# Patient Record
Sex: Female | Born: 1960 | Race: Black or African American | Hispanic: No | Marital: Single | Smoking: Never smoker
Health system: Southern US, Community
[De-identification: ages and names within clinical notes are randomized; demographics above are authoritative.]

## PROBLEM LIST (undated history)

## (undated) DIAGNOSIS — J45909 Unspecified asthma, uncomplicated: Secondary | ICD-10-CM

## (undated) DIAGNOSIS — I10 Essential (primary) hypertension: Secondary | ICD-10-CM

## (undated) HISTORY — DX: Unspecified asthma, uncomplicated: J45.909

## (undated) HISTORY — PX: TUBAL LIGATION: SHX77

---

## 2005-06-23 ENCOUNTER — Ambulatory Visit: Payer: Self-pay | Admitting: Family Medicine

## 2007-02-26 ENCOUNTER — Emergency Department: Payer: Self-pay | Admitting: Emergency Medicine

## 2007-03-10 ENCOUNTER — Ambulatory Visit: Payer: Self-pay | Admitting: Family Medicine

## 2007-06-19 ENCOUNTER — Emergency Department: Payer: Self-pay | Admitting: Emergency Medicine

## 2007-07-01 ENCOUNTER — Emergency Department: Payer: Self-pay | Admitting: Emergency Medicine

## 2007-07-11 ENCOUNTER — Emergency Department: Payer: Self-pay | Admitting: Emergency Medicine

## 2008-03-23 ENCOUNTER — Emergency Department: Payer: Self-pay | Admitting: Emergency Medicine

## 2010-08-05 IMAGING — CR DG CHEST 1V PORT
1 series · 1 of 1 positions shown · non-contrast
Comparison: none

REASON FOR EXAM: palpitations
COMMENTS:

[view not recorded]
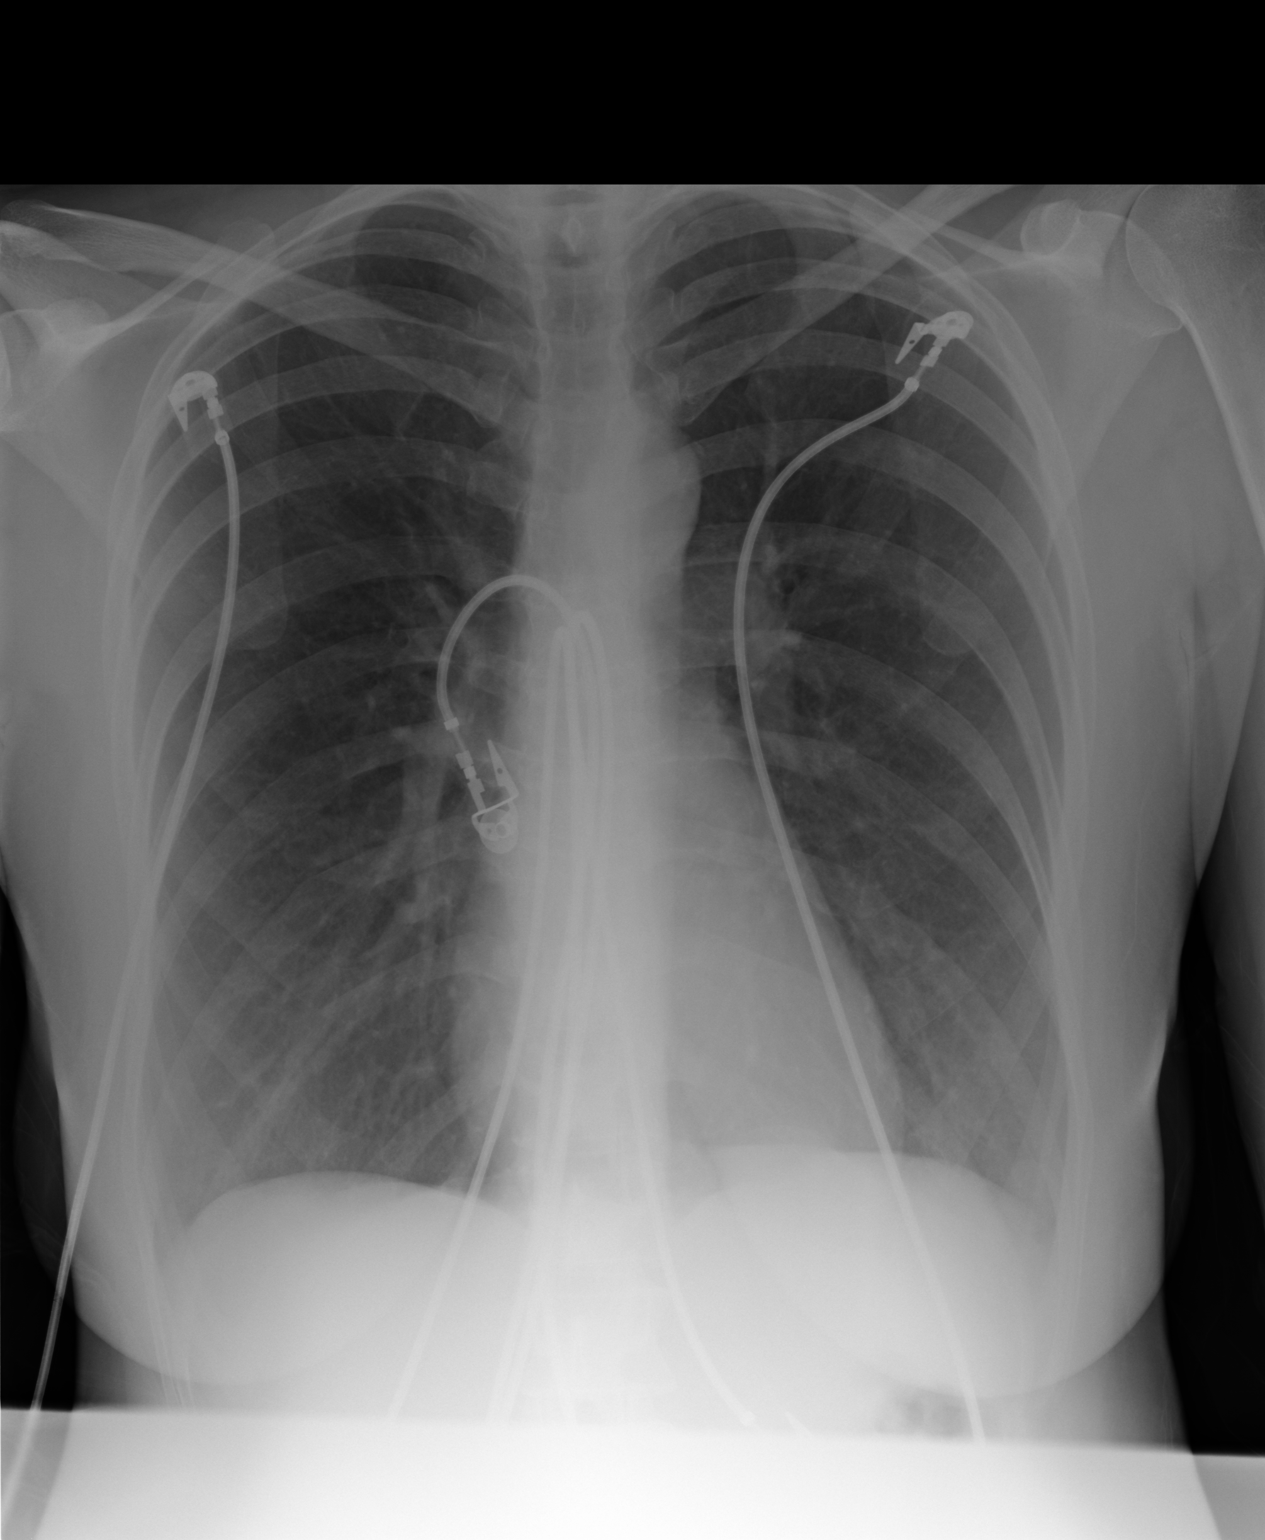

[1 of 1 positions shown; findings below may reference images not displayed]

PROCEDURE:     DXR - DXR PORTABLE CHEST SINGLE VIEW  - March 23, 2008  [DATE]

RESULT:     Comparison is made to a study 08 July, 2007.

The lungs are hyperinflated with hemidiaphragm flattening. The heart is
normal in size. The pulmonary vascularity is not engorged. There is no
evidence of pneumonia or pleural effusion. The visualized portions of the
bony thorax appear normal.
IMPRESSION: There is hyperinflation consistent with reactive airway
disease or COPD. There is no evidence of CHF or pneumonia.

## 2010-08-05 IMAGING — CT CT HEAD WITHOUT CONTRAST
2 series · 16 of 30 positions shown, 20 images · non-contrast
Comparison: none

REASON FOR EXAM: severe headaches x 3 days
COMMENTS:

PROCEDURE:     CT  - CT HEAD WITHOUT CONTRAST  - March 23, 2008  [DATE]
RESULT:     Comparison: 03/10/2007
TECHNIQUE: Multiple axial images from the foramen magnum to the vertex were
obtained without IV contrast.

[Series 2: without · axial · non-contrast · 0.40mm/px · z∈[-148,-28]mm · 13 of 37 slices shown, 17 images]
[im 3/37  brain]
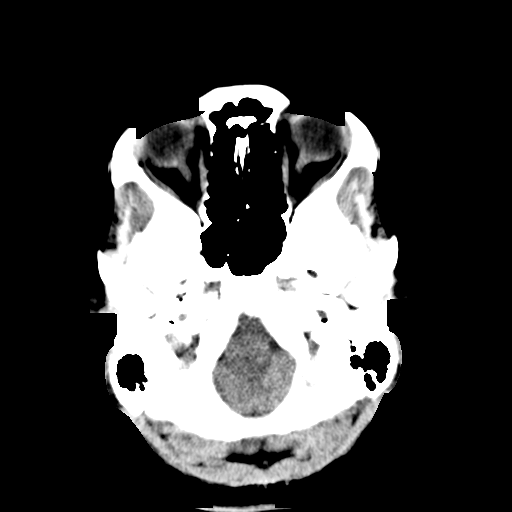
[im 3/37  bone]
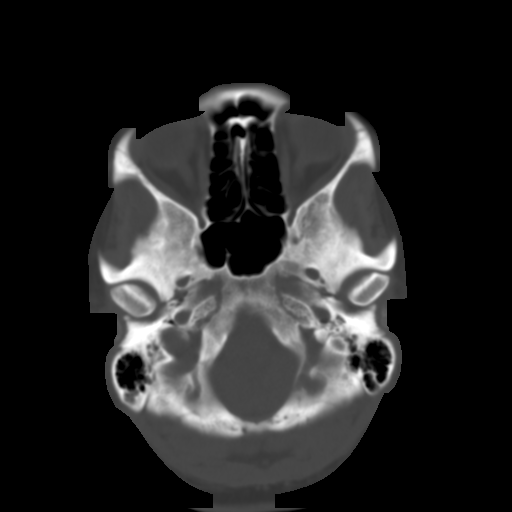
[im 6/37  brain]
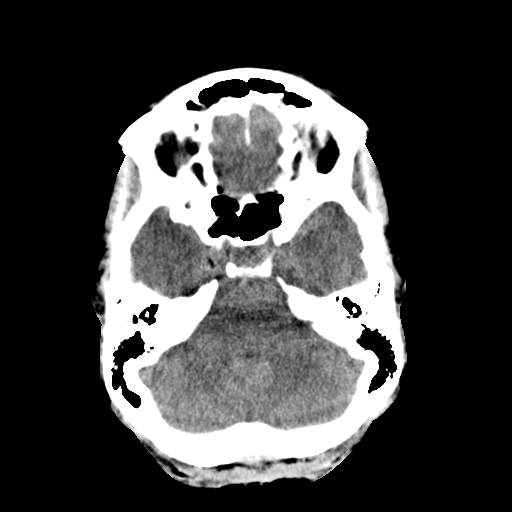
[im 8/37  brain]
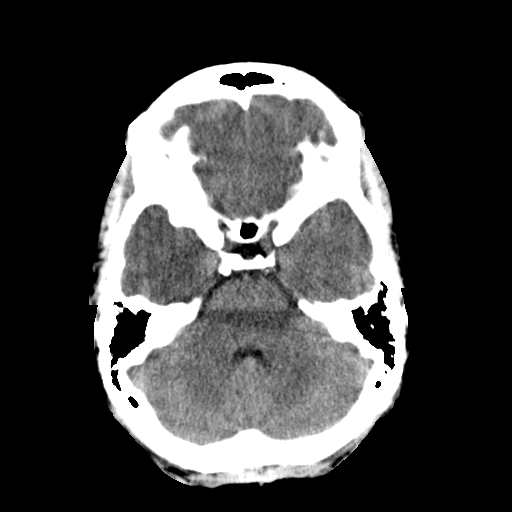
[im 11/37  brain]
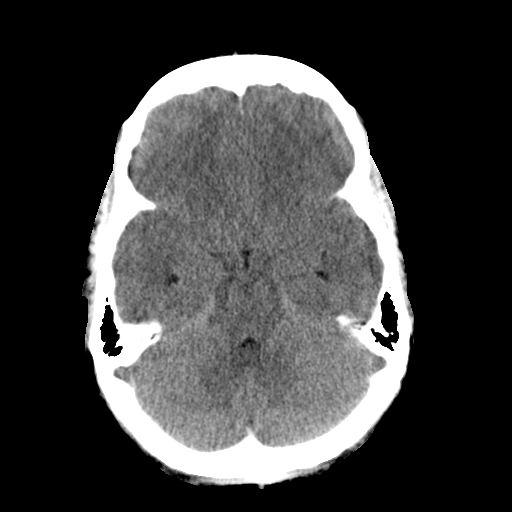
[im 13/37  brain]
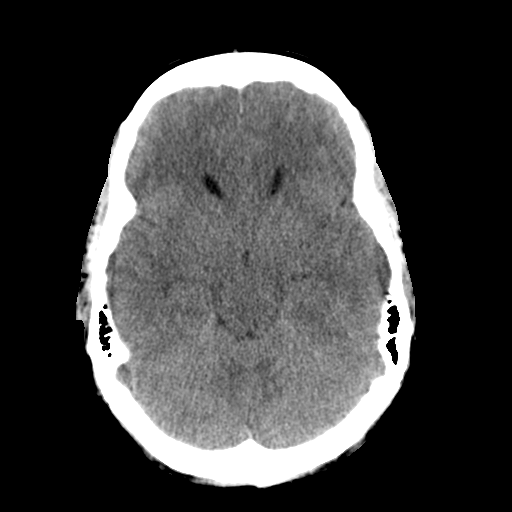
[im 13/37  bone]
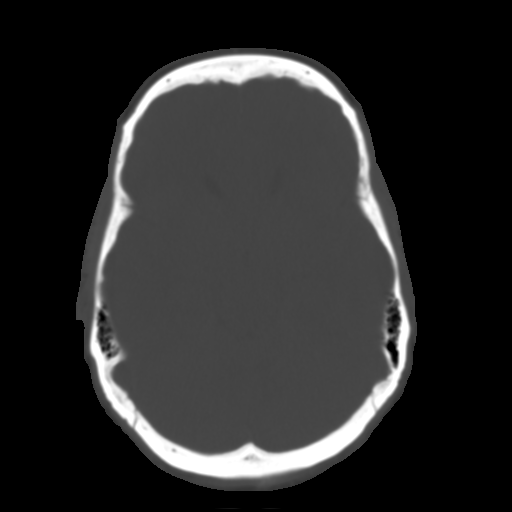
[im 16/37  brain]
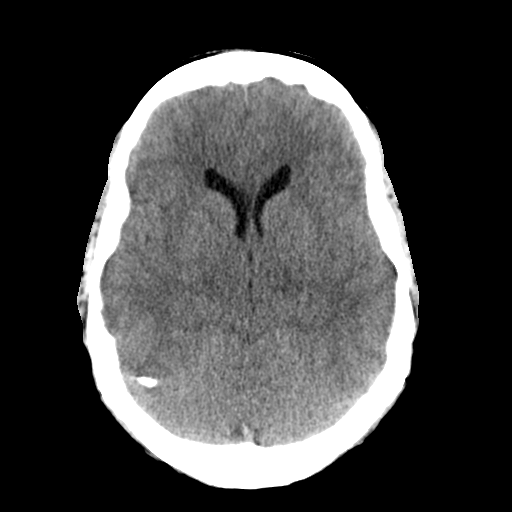
[im 19/37  brain]
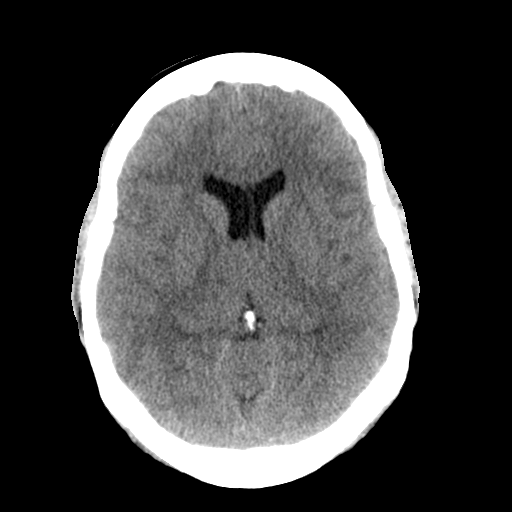
[im 21/37  brain]
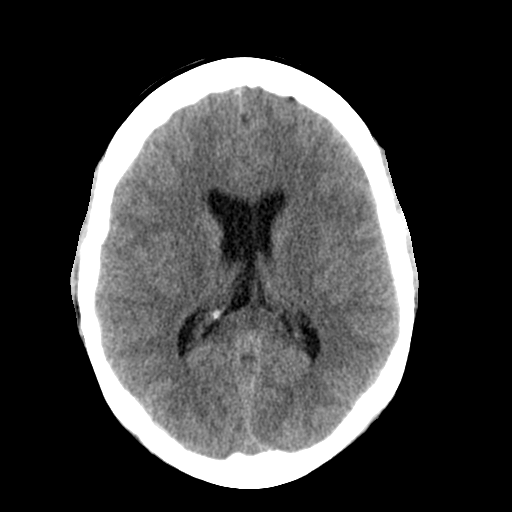
[im 24/37  brain]
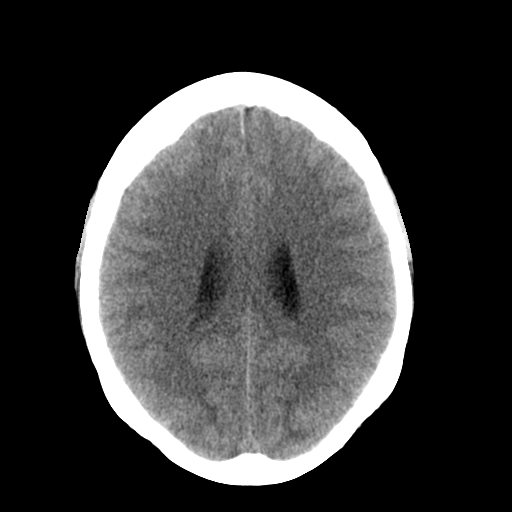
[im 24/37  bone]
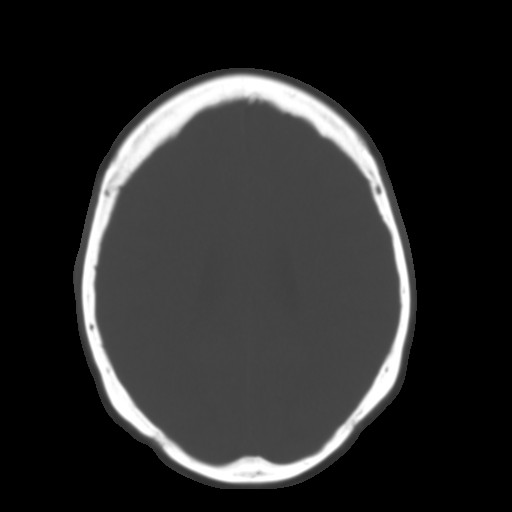
[im 26/37  brain]
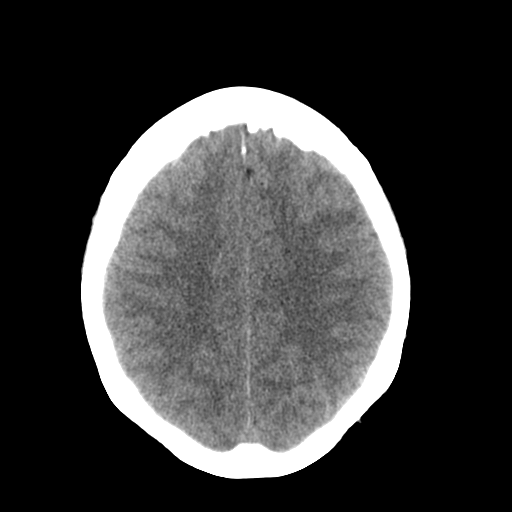
[im 29/37  brain]
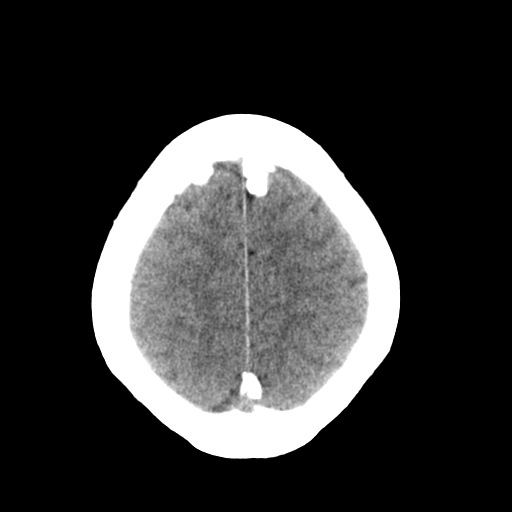
[im 31/37  brain]
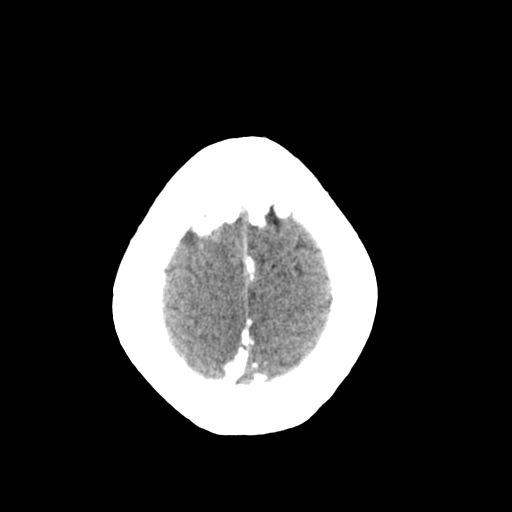
[im 34/37  brain]
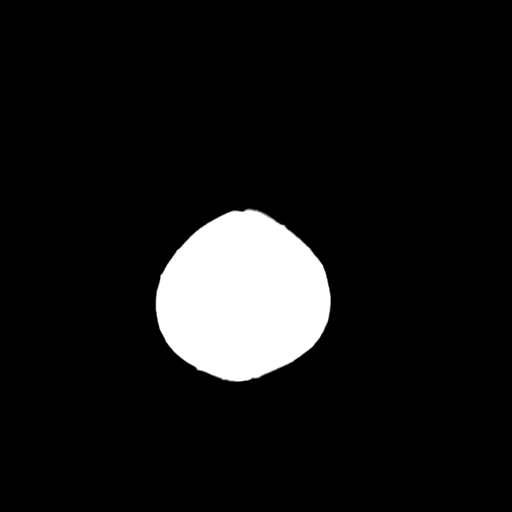
[im 34/37  bone]
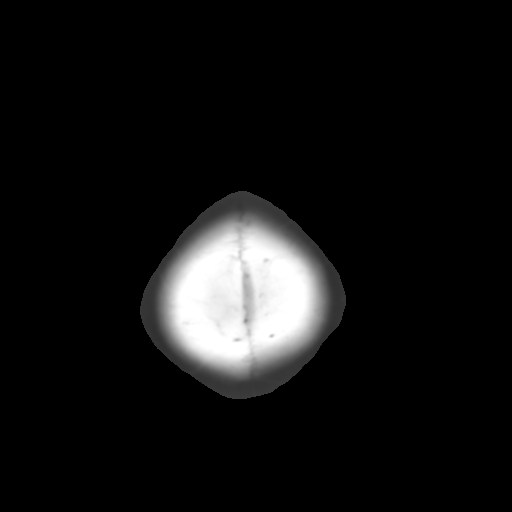

[Series 3: bone · axial · 0.40mm/px · z∈[-148,-118]mm · 3 of 37 slices shown]
[im 3/37  bone]
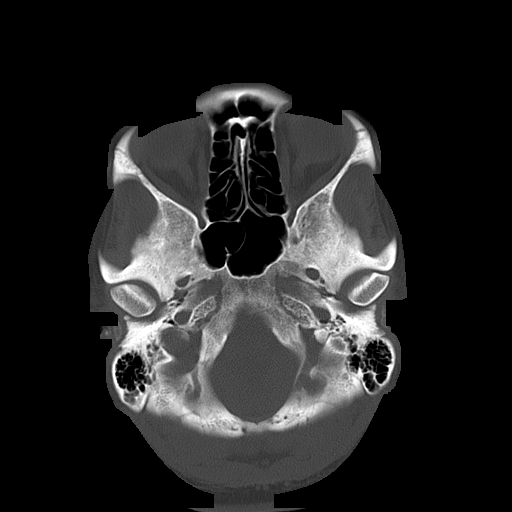
[im 8/37  bone]
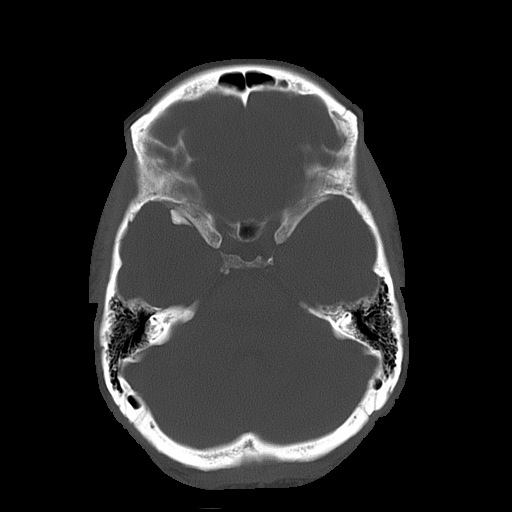
[im 13/37  bone]
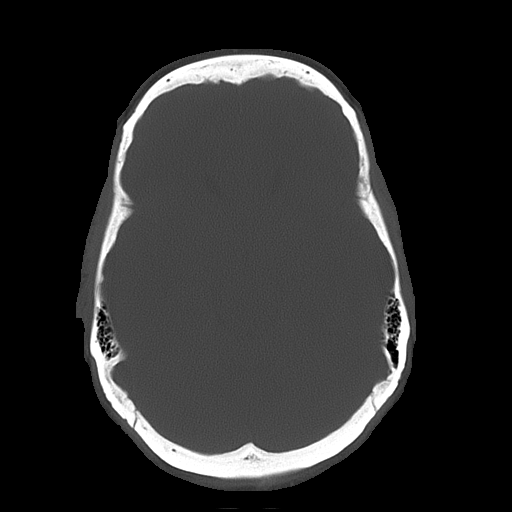

[16 of 30 positions shown; findings below may reference images not displayed]

FINDINGS: There is no evidence for mass effect, midline shift, or extra-axial fluid
collections.  There is no evidence for space-occupying lesion or
intracranial hemorrhage. There is no evidence for a cortical-based area of
acute infarction.

Ventricles and sulci are appropriate for the patient's age. The basal
cisterns are patent.

Visualized portions of the orbits are unremarkable. The paranasal sinuses
and mastoid air cells are unremarkable.

The osseous structures are unremarkable.
IMPRESSION: No acute intracranial process.

## 2014-11-30 ENCOUNTER — Ambulatory Visit: Payer: Self-pay | Admitting: Physician Assistant

## 2022-04-12 ENCOUNTER — Other Ambulatory Visit: Payer: Self-pay

## 2022-04-12 ENCOUNTER — Emergency Department
Admission: EM | Admit: 2022-04-12 | Discharge: 2022-04-13 | Disposition: A | Discharge status: Home / Self Care | Payer: Medicaid Other | Attending: Emergency Medicine | Admitting: Emergency Medicine

## 2022-04-12 ENCOUNTER — Emergency Department: Payer: Medicaid Other

## 2022-04-12 DIAGNOSIS — F419 Anxiety disorder, unspecified: Secondary | ICD-10-CM | POA: Insufficient documentation

## 2022-04-12 DIAGNOSIS — I10 Essential (primary) hypertension: Secondary | ICD-10-CM | POA: Insufficient documentation

## 2022-04-12 DIAGNOSIS — Z20822 Contact with and (suspected) exposure to covid-19: Secondary | ICD-10-CM | POA: Insufficient documentation

## 2022-04-12 DIAGNOSIS — R0789 Other chest pain: Secondary | ICD-10-CM | POA: Diagnosis not present

## 2022-04-12 DIAGNOSIS — R0602 Shortness of breath: Secondary | ICD-10-CM | POA: Insufficient documentation

## 2022-04-12 DIAGNOSIS — R002 Palpitations: Secondary | ICD-10-CM

## 2022-04-12 HISTORY — DX: Essential (primary) hypertension: I10

## 2022-04-12 LAB — RESP PANEL BY RT-PCR (RSV, FLU A&B, COVID)  RVPGX2
Influenza A by PCR: NEGATIVE
Influenza B by PCR: NEGATIVE
Resp Syncytial Virus by PCR: NEGATIVE
SARS Coronavirus 2 by RT PCR: NEGATIVE

## 2022-04-12 LAB — CBC
HCT: 37.1 % (ref 36.0–46.0)
Hemoglobin: 11.2 g/dL — ABNORMAL LOW (ref 12.0–15.0)
MCH: 21.6 pg — ABNORMAL LOW (ref 26.0–34.0)
MCHC: 30.2 g/dL (ref 30.0–36.0)
MCV: 71.6 fL — ABNORMAL LOW (ref 80.0–100.0)
Platelets: 302 10*3/uL (ref 150–400)
RBC: 5.18 MIL/uL — ABNORMAL HIGH (ref 3.87–5.11)
RDW: 15.4 % (ref 11.5–15.5)
WBC: 5.9 10*3/uL (ref 4.0–10.5)
nRBC: 0 % (ref 0.0–0.2)

## 2022-04-12 LAB — BASIC METABOLIC PANEL WITH GFR
Anion gap: 10 (ref 5–15)
BUN: 9 mg/dL (ref 6–20)
CO2: 26 mmol/L (ref 22–32)
Calcium: 9.4 mg/dL (ref 8.9–10.3)
Chloride: 100 mmol/L (ref 98–111)
Creatinine, Ser: 0.66 mg/dL (ref 0.44–1.00)
GFR, Estimated: >60 mL/min
Glucose, Bld: 108 mg/dL — ABNORMAL HIGH (ref 70–99)
Potassium: 3.1 mmol/L — ABNORMAL LOW (ref 3.5–5.1)
Sodium: 136 mmol/L (ref 135–145)

## 2022-04-12 LAB — URINALYSIS, ROUTINE W REFLEX MICROSCOPIC
Bilirubin Urine: NEGATIVE
Glucose, UA: NEGATIVE mg/dL
Hgb urine dipstick: NEGATIVE
Ketones, ur: NEGATIVE mg/dL
Leukocytes,Ua: NEGATIVE
Nitrite: NEGATIVE
Protein, ur: NEGATIVE mg/dL
Specific Gravity, Urine: 1.003 — ABNORMAL LOW (ref 1.005–1.030)
pH: 7 (ref 5.0–8.0)

## 2022-04-12 LAB — TROPONIN I (HIGH SENSITIVITY): Troponin I (High Sensitivity): 3 ng/L (ref <18)

## 2022-04-12 LAB — POC URINE PREG, ED: Preg Test, Ur: NEGATIVE

## 2022-04-12 MED ORDER — HYDROXYZINE PAMOATE 50 MG PO CAPS: 50.0000 mg | ORAL_CAPSULE | Freq: Three times a day (TID) | ORAL | 0 refills | Status: DC | PRN

## 2022-04-12 NOTE — ED Notes (Signed)
Pt in XRAY at this time. Tech called XRAY and XRAY stated they could bring pt back to room 25 once they finished her scans.

## 2022-04-12 NOTE — ED Triage Notes (Signed)
Pt to ED from home for "rapid heart rate x 2 days". Pt states she has mild chest discomfort but denies nausea and vomiting. Pt is CAOx4 and in no acute distress at this time and ambulatory in triage. Pt has loss of appetite. Pt has had this happened as well in the past. Pt denies medication for heart issues.

## 2022-04-12 NOTE — ED Provider Notes (Signed)
[facility] Provider Note    Event Date/Time   First MD Initiated Contact with Patient 04/12/22 2211     (approximate)   History   Tachycardia (X 2 days)   HPI  Susan Webb is a 62 y.o. female with a history of hypertension (not currently on any medications) presents with a sensation of her heart racing over the last several days, intermittent, associated with some shortness of breath and chest discomfort.  The patient does endorse increased anxiety and stress.  She has a family member who is currently ill in another state.  However, she denies any depression.  The patient states that she previously was on medication for hypertension but is no longer taking it.  I attempted to review the past medical records, however the patient has no prior available records here.   Physical Exam   Triage Vital Signs: ED Triage Vitals  Enc Vitals Group     BP 04/12/22 2130 (!) 113/91     Pulse Rate 04/12/22 2130 99     Resp 04/12/22 2130 20     Temp 04/12/22 2130 97.8 F (36.6 C)     Temp Source 04/12/22 2130 Oral     SpO2 04/12/22 2130 100 %     Weight 04/12/22 2131 200 lb (90.7 kg)     Height 04/12/22 2131 5' 7"$  (1.702 m)     Head Circumference --      Peak Flow --      Pain Score 04/12/22 2133 1     Pain Loc --      Pain Edu? --      Excl. in [place]? --     Most recent vital signs: Vitals:   04/12/22 2130  BP: (!) 113/91  Pulse: 99  Resp: 20  Temp: 97.8 F (36.6 C)  SpO2: 100%     General: Awake, anxious appearing, no distress.  CV:  Good peripheral perfusion.  Normal heart sounds. Resp:  Normal effort.  Lungs CTAB. Abd:  No distention.  Other:  No peripheral edema.   ED Results / Procedures / Treatments   Labs (all labs ordered are listed, but only abnormal results are displayed) Labs Reviewed  BASIC METABOLIC PANEL - Abnormal; Notable for the following components:      Result Value   Potassium 3.1 (*)    Glucose, Bld 108 (*)    All  other components within normal limits  CBC - Abnormal; Notable for the following components:   RBC 5.18 (*)    Hemoglobin 11.2 (*)    MCV 71.6 (*)    MCH 21.6 (*)    All other components within normal limits  URINALYSIS, ROUTINE W REFLEX MICROSCOPIC - Abnormal; Notable for the following components:   Color, Urine STRAW (*)    APPearance CLEAR (*)    Specific Gravity, Urine 1.003 (*)    All other components within normal limits  RESP PANEL BY RT-PCR (RSV, FLU A&B, COVID)  [id number]  POC URINE PREG, ED  TROPONIN I (HIGH SENSITIVITY)     EKG  ED ECG REPORT I, Arta Silence, the attending physician, personally viewed and interpreted this ECG.  Date: 04/12/2022 EKG Time: 2135 Rate: 92 Rhythm: normal sinus rhythm QRS Axis: normal Intervals: normal ST/T Wave abnormalities: normal Narrative Interpretation: no evidence of acute ischemia    RADIOLOGY  Chest x-ray: I independently viewed and interpreted the images; there is no focal consolidation or edema  PROCEDURES:  Critical Care performed:  No  Procedures   MEDICATIONS ORDERED IN ED: Medications - No data to display   IMPRESSION / MDM / Industry / ED COURSE  I reviewed the triage vital signs and the nursing notes.  62 year old female with a history of hypertension presents with intermittent palpitations, shortness of breath, chest discomfort over the last several days.  She does endorse anxiety.  On exam she appears quite anxious.  Her vital signs are otherwise normal.  Her heart rate has been in the 90s throughout her time in the ED although she currently is not having the symptoms.  Differential diagnosis includes, but is not limited to, acute anxiety, dehydration, electrolyte abnormality, viral syndrome, less likely cardiac dysrhythmia.  We will obtain basic labs, cardiac enzymes, respiratory panel, and reassess.  Patient's presentation is most consistent with acute presentation with potential threat to  life or bodily function.  The patient is on the cardiac monitor to evaluate for evidence of arrhythmia and/or significant heart rate changes.  ----------------------------------------- 11:01 PM on 04/12/2022 -----------------------------------------  Lab workup is unremarkable so far.  Troponin is negative.  Electrolytes are normal except for borderline low potassium.  We will obtain a repeat troponin.  The respiratory panel is still pending.  If these are negative anticipate discharge home.  I have signed the patient out to the oncoming ED physician Dr. Starleen Blue.   FINAL CLINICAL IMPRESSION(S) / ED DIAGNOSES   Final diagnoses:  Anxiety  Palpitations     Rx / DC Orders   ED Discharge Orders          Ordered    hydrOXYzine (VISTARIL) 50 MG capsule  3 times daily PRN        04/12/22 2300             Note:  This document was prepared using Dragon voice recognition software and may include unintentional dictation errors.    Arta Silence, MD 04/12/22 [phone]

## 2022-04-13 LAB — TROPONIN I (HIGH SENSITIVITY): Troponin I (High Sensitivity): 3 ng/L (ref <18)

## 2022-04-13 NOTE — ED Provider Notes (Signed)
Patient was signed to me pending repeat troponin.  Repeat troponin is negative.  I assessed the patient her vitals are normal she feels improved.  Recommended PCP follow-up.  Dr. Cherylann Banas has prescribed hydroxyzine for anxiety.   Rada Hay, MD 04/13/22 [phone]

## 2023-07-27 DIAGNOSIS — R1030 Lower abdominal pain, unspecified: Secondary | ICD-10-CM | POA: Diagnosis not present

## 2023-07-27 DIAGNOSIS — N3001 Acute cystitis with hematuria: Secondary | ICD-10-CM | POA: Diagnosis not present

## 2023-07-27 DIAGNOSIS — B370 Candidal stomatitis: Secondary | ICD-10-CM | POA: Diagnosis not present

## 2023-07-27 DIAGNOSIS — R300 Dysuria: Secondary | ICD-10-CM | POA: Diagnosis not present

## 2023-08-07 DIAGNOSIS — R350 Frequency of micturition: Secondary | ICD-10-CM | POA: Diagnosis not present

## 2023-08-07 DIAGNOSIS — R000 Tachycardia, unspecified: Secondary | ICD-10-CM | POA: Diagnosis not present

## 2023-08-07 DIAGNOSIS — B370 Candidal stomatitis: Secondary | ICD-10-CM | POA: Diagnosis not present

## 2023-08-09 ENCOUNTER — Emergency Department (HOSPITAL_COMMUNITY)

## 2023-08-09 ENCOUNTER — Ambulatory Visit: Payer: Self-pay | Admitting: *Deleted

## 2023-08-09 ENCOUNTER — Other Ambulatory Visit: Payer: Self-pay

## 2023-08-09 ENCOUNTER — Encounter (HOSPITAL_COMMUNITY): Payer: Self-pay | Admitting: Emergency Medicine

## 2023-08-09 ENCOUNTER — Emergency Department (HOSPITAL_COMMUNITY): Admission: EM | Admit: 2023-08-09 | Discharge: 2023-08-09 | Disposition: A | Discharge status: Home / Self Care

## 2023-08-09 DIAGNOSIS — I700 Atherosclerosis of aorta: Secondary | ICD-10-CM | POA: Diagnosis not present

## 2023-08-09 DIAGNOSIS — R5383 Other fatigue: Secondary | ICD-10-CM | POA: Insufficient documentation

## 2023-08-09 DIAGNOSIS — J929 Pleural plaque without asbestos: Secondary | ICD-10-CM | POA: Diagnosis not present

## 2023-08-09 DIAGNOSIS — R600 Localized edema: Secondary | ICD-10-CM | POA: Diagnosis not present

## 2023-08-09 DIAGNOSIS — R0602 Shortness of breath: Secondary | ICD-10-CM | POA: Diagnosis not present

## 2023-08-09 DIAGNOSIS — I771 Stricture of artery: Secondary | ICD-10-CM | POA: Diagnosis not present

## 2023-08-09 LAB — CBC
HCT: 41 % (ref 36.0–46.0)
Hemoglobin: 12.5 g/dL (ref 12.0–15.0)
MCH: 22.1 pg — ABNORMAL LOW (ref 26.0–34.0)
MCHC: 30.5 g/dL (ref 30.0–36.0)
MCV: 72.6 fL — ABNORMAL LOW (ref 80.0–100.0)
Platelets: 299 10*3/uL (ref 150–400)
RBC: 5.65 MIL/uL — ABNORMAL HIGH (ref 3.87–5.11)
RDW: 14.9 % (ref 11.5–15.5)
WBC: 4.6 10*3/uL (ref 4.0–10.5)
nRBC: 0 % (ref 0.0–0.2)

## 2023-08-09 LAB — BASIC METABOLIC PANEL WITH GFR
Anion gap: 11 (ref 5–15)
BUN: 9 mg/dL (ref 8–23)
CO2: 25 mmol/L (ref 22–32)
Calcium: 9.7 mg/dL (ref 8.9–10.3)
Chloride: 103 mmol/L (ref 98–111)
Creatinine, Ser: 0.66 mg/dL (ref 0.44–1.00)
GFR, Estimated: >60 mL/min (ref >60)
Glucose, Bld: 102 mg/dL — ABNORMAL HIGH (ref 70–99)
Potassium: 3.9 mmol/L (ref 3.5–5.1)
Sodium: 139 mmol/L (ref 135–145)

## 2023-08-09 LAB — URINALYSIS, ROUTINE W REFLEX MICROSCOPIC
Bilirubin Urine: NEGATIVE
Glucose, UA: NEGATIVE mg/dL
Ketones, ur: NEGATIVE mg/dL
Leukocytes,Ua: NEGATIVE
Nitrite: NEGATIVE
Protein, ur: NEGATIVE mg/dL
Specific Gravity, Urine: <1.005 — ABNORMAL LOW (ref 1.005–1.030)
pH: 6 (ref 5.0–8.0)

## 2023-08-09 LAB — URINALYSIS, MICROSCOPIC (REFLEX)

## 2023-08-09 LAB — D-DIMER, QUANTITATIVE: D-Dimer, Quant: 0.28 ug{FEU}/mL (ref 0.00–0.50)

## 2023-08-09 MED ORDER — AMLODIPINE BESYLATE 5 MG PO TABS: 5.0000 mg | ORAL_TABLET | Freq: Every day | ORAL | 0 refills | Status: DC

## 2023-08-09 NOTE — ED Provider Triage Note (Signed)
 Emergency Medicine Provider Triage Evaluation Note  Susan Webb , a 63 y.o. female  was evaluated in triage.  Pt complains of fatigue since UTI x 1 wk. On nystatin for oral thrust  Review of Systems  Positive: Chills, SHOB, reduced appetite Negative: Fever, N/V/D, CP  Physical Exam  BP (!) 175/105   Pulse (!) 107   Temp 98.7 F (37.1 C)   Resp 16   Ht 5' 7 (1.702 m)   Wt 86.2 kg   LMP  (LMP Unknown)   SpO2 96%   BMI 29.76 kg/m  Gen:   Awake, no distress   Resp:  Normal effort  MSK:   Moves extremities without difficulty  Other:    Medical Decision Making  Medically screening exam initiated at 1:14 PM.  Appropriate orders placed.  Susan Webb was informed that the remainder of the evaluation will be completed by another provider, this initial triage assessment does not replace that evaluation, and the importance of remaining in the ED until their evaluation is complete.  Labs ordered   Carie Charity, PA-C 08/09/23 1317

## 2023-08-09 NOTE — Discharge Instructions (Addendum)
 We are prescribing blood pressure medications.  Please try to keep a log of your blood pressures and follow-up with your primary doctor.  We are also referring you to cardiology for possible echocardiogram regarding your fatigue, lower extremity edema and shortness of breath.  They should call to schedule an appointment.  Return immediately if develop fevers, chills, lightheadedness, passout, palpitations, feel your heart is racing, chest pain, increased work of breathing inability to eat or drink to nausea vomiting or develop any new or worsening symptoms that are concerning to you.

## 2023-08-09 NOTE — Telephone Encounter (Signed)
       FYI Only or Action Required?: FYI only for provider  Patient was last seen in primary care on requesting new patient appt. Called Nurse Triage reporting Fatigue. Symptoms began a week ago. Interventions attempted: Rest, hydration, or home remedies. Symptoms are: gradually worsening.  Triage Disposition: Go to ED Now (Notify PCP)  Patient/caregiver understands and will follow disposition?: Yes                    Copied from CRM [id number]. Topic: Clinical - Red Word Triage >> Aug 09, 2023  8:48 AM Clyde Darling P wrote: Red Word that prompted transfer to Nurse Triage:  Fatigue , muscle weakness, heart beat too fast    ----------------------------------------------------------------------- From previous Reason for Contact - Scheduling: Patient/patient representative is calling to schedule an appointment. Refer to attachments for appointment information. Reason for Disposition  Extra heartbeats, irregular heart beating, or heart is beating very fast  (i.e., palpitations)  Answer Assessment - Initial Assessment Questions 1. DESCRIPTION: Describe how you are feeling.     Weakness and fatigue and heart palpitations started after UTI treatment.  2. SEVERITY: How bad is it?  Can you stand and walk?   - MILD (0-3): Feels weak or tired, but does not interfere with work, school or normal activities.   - MODERATE (4-7): Able to stand and walk; weakness interferes with work, school, or normal activities.   - SEVERE (8-10): Unable to stand or walk; unable to do usual activities.     Moderate can do normal activities  3. ONSET: When did these symptoms begin? (e.g., hours, days, weeks, months)     Moderate can do some activities but weaknesss 4. CAUSE: What do you think is causing the weakness or fatigue? (e.g., not drinking enough fluids, medical problem, trouble sleeping)     Not sure drinking Pedialyte  5. NEW MEDICINES:  Have you started on any new medicines  recently? (e.g., opioid pain medicines, benzodiazepines, muscle relaxants, antidepressants, antihistamines, neuroleptics, beta blockers)     For UTI 6. OTHER SYMPTOMS: Do you have any other symptoms? (e.g., chest pain, fever, cough, SOB, vomiting, diarrhea, bleeding, other areas of pain)     Heart feels funny , flipping, candida on tongue, taking medication nystatin, feels like going to pass out at times but not now . 7. PREGNANCY: Is there any chance you are pregnant? When was your last menstrual period?     Na   Recommended ED , no PCP. Attempted to schedule new patient appt but locations attempted not until next year at [facility].  Protocols used: Weakness (Generalized) and Fatigue-A-AH

## 2023-08-09 NOTE — ED Provider Notes (Signed)
 [place] EMERGENCY DEPARTMENT AT [facility] Provider Note   CSN: [id number] Arrival date & time: 08/09/23  1109     Patient presents with: Fatigue, Palpitations, and Shortness of Breath   Susan Webb is a 63 y.o. female.   This is a 63 year old female presents emergency department of fatigue and being excessively tired since being treated for urinary tract infection a week ago.  She notes that performing ADLs are seemingly a chore and require a lot of effort.  Reports some shortness of breath, that she describes as dyspnea on exertion.  No lightheadedness, chest pain, no nausea or vomiting.  Does note some lower extremity edema but that has been present before symptoms of started.  Otherwise low risk for DVT/PE based on Wells criteria.   Palpitations Associated symptoms: shortness of breath   Shortness of Breath      Prior to Admission medications   Medication Sig Start Date End Date Taking? Authorizing Provider  amLODipine (NORVASC) 5 MG tablet Take 1 tablet (5 mg total) by mouth daily. 08/09/23  Yes Rolinda Climes, DO  hydrOXYzine  (VISTARIL ) 50 MG capsule Take 1 capsule (50 mg total) by mouth 3 (three) times daily as needed for anxiety. 04/12/22   Lind Repine, MD    Allergies: Patient has no allergy information on record.    Review of Systems  Respiratory:  Positive for shortness of breath.   Cardiovascular:  Positive for palpitations.    Updated Vital Signs BP (!) 140/78   Pulse 88   Temp 98.4 F (36.9 C) (Oral)   Resp 18   Ht 5' 7 (1.702 m)   Wt 86.2 kg   LMP  (LMP Unknown)   SpO2 100%   BMI 29.76 kg/m   Physical Exam Vitals and nursing note reviewed.  Constitutional:      General: She is not in acute distress.    Appearance: She is not toxic-appearing.   Cardiovascular:     Rate and Rhythm: Normal rate and regular rhythm.  Pulmonary:     Effort: Pulmonary effort is normal.     Breath sounds: No decreased breath sounds, wheezing  or rhonchi.   Musculoskeletal:        General: Normal range of motion.     Cervical back: Normal range of motion.     Right lower leg: Edema present.     Left lower leg: Edema present.   Skin:    General: Skin is warm.   Neurological:     General: No focal deficit present.     Mental Status: She is alert.   Psychiatric:        Mood and Affect: Mood normal.     (all labs ordered are listed, but only abnormal results are displayed) Labs Reviewed  BASIC METABOLIC PANEL WITH GFR - Abnormal; Notable for the following components:      Result Value   Glucose, Bld 102 (*)    All other components within normal limits  CBC - Abnormal; Notable for the following components:   RBC 5.65 (*)    MCV 72.6 (*)    MCH 22.1 (*)    All other components within normal limits  URINALYSIS, ROUTINE W REFLEX MICROSCOPIC - Abnormal; Notable for the following components:   Specific Gravity, Urine <1.005 (*)    Hgb urine dipstick TRACE (*)    All other components within normal limits  URINALYSIS, MICROSCOPIC (REFLEX) - Abnormal; Notable for the following components:   Bacteria, UA  RARE (*)    All other components within normal limits  URINE CULTURE  D-DIMER, QUANTITATIVE    EKG: EKG Interpretation Date/Time:  Monday August 09 2023 11:20:32 EDT Ventricular Rate:  107 PR Interval:  180 QRS Duration:  102 QT Interval:  346 QTC Calculation: 461 R Axis:   -12  Text Interpretation: Sinus tachycardia Otherwise normal ECG When compared with ECG of 12-Apr-2022 21:35, PREVIOUS ECG IS PRESENT Confirmed by Elise Guile [phone]) on 08/09/2023 3:32:38 PM  Radiology: Lenell Query Chest 2 View Result Date: 08/09/2023 CLINICAL DATA:  SOB EXAM: CHEST - 2 VIEW COMPARISON:  April 12, 2022 FINDINGS: Biapical pleural thickening. Flattening of both diaphragms, which can be seen in emphysema. No focal airspace consolidation, pleural effusion, or pneumothorax. No cardiomegaly. Tortuous aorta with aortic atherosclerosis. No  acute fracture or destructive lesions. Multilevel thoracic osteophytosis. IMPRESSION: No acute cardiopulmonary abnormality. Electronically Signed   By: Rance Burrows M.D.   On: 08/09/2023 13:11     Procedures   Medications Ordered in the ED - No data to display  Clinical Course as of 08/09/23 1708  Mon Aug 09, 2023  1533 DG Chest 2 View IMPRESSION: No acute cardiopulmonary abnormality.   [TY]  1533 CBC(!) No leukocytosis to suggest systemic infection.  No anemia to explain patient's fatigue [TY]  1533 Basic metabolic panel(!) No significant metabolic derangements.  Normal kidney function. [TY]  1533 Urinalysis, Routine w reflex microscopic -Urine, Clean Catch(!) Not consistent with urinary tract infection [TY]  1533 D-Dimer, Quant: 0.28 DVT/PE less likely [TY]    Clinical Course User Index [TY] Rolinda Climes, DO                                 Medical Decision Making This is a 63 year old female presenting emergency department for fatigue.  She is afebrile, mildly elevated heart rate initially, but improved without intervention.  Hypertensive, again improved without intervention.  Maintaining oxygen saturation on room air.  Clinically well-appearing, not in respiratory distress.  Clear lungs.  Does have some lower extremity edema, low risk for PE based on Wells criteria.  D-dimer also negative.  Not having chest pain.  EKG appears to be normal sinus rhythm on my independent interpretation.  No ST segment changes to indicate ischemia.  Other labs as noted in the ED course reassuring.  Unclear etiology for patient's symptoms.  Blood pressure is elevated, is requesting to start antihypertensive.  Will start on low-dose amlodipine.  Follow-up with primary doctor.  Will also give referral to cardiology as she does have some lower extremity edema, dyspnea on exertion.  Does not appear to be in overt decompensated heart failure, but could have a heart failure type component to her  symptoms.  I do not feel that she would benefit from admission at this time with reassuring vitals and workup.  Stable for discharge.  Amount and/or Complexity of Data Reviewed External Data Reviewed:     Details: Does not appear to have prior cardiac imaging or workup per my chart review Labs: ordered. Decision-making details documented in ED Course. Radiology: ordered and independent interpretation performed. Decision-making details documented in ED Course.    Details: Chest x-ray without pneumonia pneumothorax ECG/medicine tests: independent interpretation performed.  Risk Prescription drug management. Decision regarding hospitalization. Diagnosis or treatment significantly limited by social determinants of health. Risk Details: Poor health literacy       Final diagnoses:  Other fatigue  Lower extremity edema    ED Discharge Orders          Ordered    amLODipine (NORVASC) 5 MG tablet  Daily        08/09/23 1702    Ambulatory referral to Cardiology       Comments: If you have not heard from the Cardiology office within the next 72 hours please call [phone].   08/09/23 1703               Rolinda Climes, DO 08/09/23 1708

## 2023-08-09 NOTE — ED Triage Notes (Signed)
 Pt. Stated, I had a UTI a couple of weeks ago and since then Ive been very very fatigue and have palpitations  off and on but mainly when Im doing something. I went back to UC 2 days ago and they said my UTI was gone.

## 2023-08-10 LAB — URINE CULTURE: Culture: NO GROWTH

## 2023-08-18 ENCOUNTER — Emergency Department (HOSPITAL_COMMUNITY): Admission: EM | Admit: 2023-08-18 | Discharge: 2023-08-18 | Disposition: A | Discharge status: Home / Self Care

## 2023-08-18 ENCOUNTER — Emergency Department (HOSPITAL_COMMUNITY)

## 2023-08-18 ENCOUNTER — Other Ambulatory Visit: Payer: Self-pay

## 2023-08-18 DIAGNOSIS — R079 Chest pain, unspecified: Secondary | ICD-10-CM | POA: Diagnosis not present

## 2023-08-18 DIAGNOSIS — M7989 Other specified soft tissue disorders: Secondary | ICD-10-CM | POA: Diagnosis not present

## 2023-08-18 DIAGNOSIS — I10 Essential (primary) hypertension: Secondary | ICD-10-CM | POA: Diagnosis not present

## 2023-08-18 DIAGNOSIS — R0600 Dyspnea, unspecified: Secondary | ICD-10-CM | POA: Diagnosis present

## 2023-08-18 DIAGNOSIS — R0609 Other forms of dyspnea: Secondary | ICD-10-CM | POA: Diagnosis not present

## 2023-08-18 DIAGNOSIS — Z79899 Other long term (current) drug therapy: Secondary | ICD-10-CM | POA: Insufficient documentation

## 2023-08-18 DIAGNOSIS — R609 Edema, unspecified: Secondary | ICD-10-CM

## 2023-08-18 DIAGNOSIS — R519 Headache, unspecified: Secondary | ICD-10-CM | POA: Diagnosis not present

## 2023-08-18 DIAGNOSIS — R2243 Localized swelling, mass and lump, lower limb, bilateral: Secondary | ICD-10-CM | POA: Insufficient documentation

## 2023-08-18 DIAGNOSIS — R5383 Other fatigue: Secondary | ICD-10-CM | POA: Diagnosis not present

## 2023-08-18 DIAGNOSIS — R42 Dizziness and giddiness: Secondary | ICD-10-CM | POA: Insufficient documentation

## 2023-08-18 DIAGNOSIS — R0602 Shortness of breath: Secondary | ICD-10-CM | POA: Diagnosis not present

## 2023-08-18 LAB — TROPONIN I (HIGH SENSITIVITY): Troponin I (High Sensitivity): 2 ng/L (ref <18)

## 2023-08-18 LAB — CBC WITH DIFFERENTIAL/PLATELET
Abs Immature Granulocytes: 0.01 10*3/uL (ref 0.00–0.07)
Basophils Absolute: 0 10*3/uL (ref 0.0–0.1)
Basophils Relative: 1 %
Eosinophils Absolute: 0 10*3/uL (ref 0.0–0.5)
Eosinophils Relative: 0 %
HCT: 37.3 % (ref 36.0–46.0)
Hemoglobin: 11.6 g/dL — ABNORMAL LOW (ref 12.0–15.0)
Immature Granulocytes: 0 %
Lymphocytes Relative: 31 %
Lymphs Abs: 1.4 10*3/uL (ref 0.7–4.0)
MCH: 22.3 pg — ABNORMAL LOW (ref 26.0–34.0)
MCHC: 31.1 g/dL (ref 30.0–36.0)
MCV: 71.7 fL — ABNORMAL LOW (ref 80.0–100.0)
Monocytes Absolute: 0.5 10*3/uL (ref 0.1–1.0)
Monocytes Relative: 10 %
Neutro Abs: 2.6 10*3/uL (ref 1.7–7.7)
Neutrophils Relative %: 58 %
Platelets: 288 10*3/uL (ref 150–400)
RBC: 5.2 MIL/uL — ABNORMAL HIGH (ref 3.87–5.11)
RDW: 15.1 % (ref 11.5–15.5)
WBC: 4.4 10*3/uL (ref 4.0–10.5)
nRBC: 0 % (ref 0.0–0.2)

## 2023-08-18 LAB — COMPREHENSIVE METABOLIC PANEL WITH GFR
ALT: 25 U/L (ref 0–44)
AST: 23 U/L (ref 15–41)
Albumin: 3.9 g/dL (ref 3.5–5.0)
Alkaline Phosphatase: 102 U/L (ref 38–126)
Anion gap: 10 (ref 5–15)
BUN: 9 mg/dL (ref 8–23)
CO2: 24 mmol/L (ref 22–32)
Calcium: 9.4 mg/dL (ref 8.9–10.3)
Chloride: 108 mmol/L (ref 98–111)
Creatinine, Ser: 0.65 mg/dL (ref 0.44–1.00)
GFR, Estimated: >60 mL/min (ref >60)
Glucose, Bld: 106 mg/dL — ABNORMAL HIGH (ref 70–99)
Potassium: 3.4 mmol/L — ABNORMAL LOW (ref 3.5–5.1)
Sodium: 142 mmol/L (ref 135–145)
Total Bilirubin: 0.6 mg/dL (ref 0.0–1.2)
Total Protein: 7.3 g/dL (ref 6.5–8.1)

## 2023-08-18 LAB — BRAIN NATRIURETIC PEPTIDE: B Natriuretic Peptide: 9.2 pg/mL (ref 0.0–100.0)

## 2023-08-18 LAB — TSH: TSH: 1.169 u[IU]/mL (ref 0.350–4.500)

## 2023-08-18 MED ORDER — ALBUTEROL SULFATE HFA 108 (90 BASE) MCG/ACT IN AERS: 1.0000 | INHALATION_SPRAY | Freq: Four times a day (QID) | RESPIRATORY_TRACT | 0 refills | Status: AC | PRN

## 2023-08-18 MED ORDER — IOHEXOL 350 MG/ML SOLN
67.0000 mL | Freq: Once | INTRAVENOUS | Status: AC | PRN
  Administered 2023-08-18: 67 mL via INTRAVENOUS

## 2023-08-18 MED ORDER — PANTOPRAZOLE SODIUM 40 MG PO TBEC: 40.0000 mg | DELAYED_RELEASE_TABLET | Freq: Two times a day (BID) | ORAL | 0 refills | Status: AC

## 2023-08-18 NOTE — ED Provider Notes (Signed)
 [place] EMERGENCY DEPARTMENT AT [facility] Provider Note   CSN: [id number] Arrival date & time: 08/18/23  9046     Patient presents with: Chest Pain and Shortness of Breath   Susan Webb is a 63 y.o. female.   63 year old female with past medical history of hypertension presenting to the emergency department today with concern for worsening dyspnea on exertion.  This is gone to the point that the patient is having difficulty cooking or walking more than 15-20 steps.  This has been going on for the past few weeks.  The patient reports she has had a lot of pressure on her chest.  She denies any significant fevers, chills, or cough.  Denies any history of DVT or pulmonary embolism, recent surgeries, recent travel.  She states that she has been having swelling in both legs and is reporting some pain in her calf on the left.  She denies any sharp pleuritic pain.  She states that she has been having a lot of intermittent lightheadedness with this.  She came to the ER today for further evaluation regarding this.   Chest Pain Associated symptoms: shortness of breath   Shortness of Breath Associated symptoms: chest pain        Prior to Admission medications   Medication Sig Start Date End Date Taking? Authorizing Provider  amLODipine (NORVASC) 5 MG tablet Take 1 tablet (5 mg total) by mouth daily. 08/09/23   Neysa Caron JINNY, DO  hydrOXYzine  (VISTARIL ) 50 MG capsule Take 1 capsule (50 mg total) by mouth 3 (three) times daily as needed for anxiety. 04/12/22   Jacolyn Pae, MD    Allergies: Prednisone    Review of Systems  Respiratory:  Positive for shortness of breath.   Cardiovascular:  Positive for chest pain.  All other systems reviewed and are negative.   Updated Vital Signs BP (!) 146/70   Pulse 84   Temp 98.4 F (36.9 C) (Oral)   Resp 17   Ht 5' 7 (1.702 m)   Wt 86.2 kg   LMP  (LMP Unknown)   SpO2 100%   BMI 29.76 kg/m   Physical Exam Vitals and  nursing note reviewed.   Gen: NAD, speaking in full sentences Eyes: PERRL, EOMI HEENT: no oropharyngeal swelling Neck: trachea midline Resp: clear to auscultation bilaterally, mildly diminished at bilateral lung bases Card: RRR, no murmurs, rubs, or gallops Abd: nontender, nondistended Extremities: 2+ pitting edema noted to the bilateral lower extremities Vascular: 2+ radial pulses bilaterally, 2+ DP pulses bilaterally Skin: no rashes Psyc: acting appropriately   (all labs ordered are listed, but only abnormal results are displayed) Labs Reviewed  CBC WITH DIFFERENTIAL/PLATELET - Abnormal; Notable for the following components:      Result Value   RBC 5.20 (*)    Hemoglobin 11.6 (*)    MCV 71.7 (*)    MCH 22.3 (*)    All other components within normal limits  COMPREHENSIVE METABOLIC PANEL WITH GFR - Abnormal; Notable for the following components:   Potassium 3.4 (*)    Glucose, Bld 106 (*)    All other components within normal limits  TSH  BRAIN NATRIURETIC PEPTIDE  TROPONIN I (HIGH SENSITIVITY)    EKG: EKG Interpretation Date/Time:  Wednesday August 18 2023 10:02:42 EDT Ventricular Rate:  89 PR Interval:  180 QRS Duration:  92 QT Interval:  384 QTC Calculation: 467 R Axis:   44  Text Interpretation: Normal sinus rhythm Normal ECG When compared with  ECG of 09-Aug-2023 11:20, PREVIOUS ECG IS PRESENT Confirmed by Doretha Folks [id number]) on 08/18/2023 10:06:16 AM  Radiology: VAS US  LOWER EXTREMITY VENOUS (DVT) (ONLY [facility] & [facility]) Result Date: 08/18/2023  Lower Venous DVT Study Patient Name:  Susan Webb  Date of Exam:   08/18/2023 Medical Rec #: [id number]        Accession #:    [id number] Date of Birth: Jul 10, 1960         Patient Gender: F Patient Age:   15 years Exam Location:  [facility] Procedure:      VAS US  LOWER EXTREMITY VENOUS (DVT) Referring Phys: PRENTICE Cory Kitt --------------------------------------------------------------------------------  Indications: Edema,  and SOB.  Comparison Study: No previous exams Performing Technologist: Jody Hill RVT, RDMS  Examination Guidelines: A complete evaluation includes B-mode imaging, spectral Doppler, color Doppler, and power Doppler as needed of all accessible portions of each vessel. Bilateral testing is considered an integral part of a complete examination. Limited examinations for reoccurring indications may be performed as noted. The reflux portion of the exam is performed with the patient in reverse Trendelenburg.  +---------+---------------+---------+-----------+----------+--------------+ RIGHT    CompressibilityPhasicitySpontaneityPropertiesThrombus Aging +---------+---------------+---------+-----------+----------+--------------+ CFV      Full           Yes      Yes                                 +---------+---------------+---------+-----------+----------+--------------+ SFJ      Full                                                        +---------+---------------+---------+-----------+----------+--------------+ FV Prox  Full           Yes      Yes                                 +---------+---------------+---------+-----------+----------+--------------+ FV Mid   Full           Yes      Yes                                 +---------+---------------+---------+-----------+----------+--------------+ FV DistalFull           Yes      Yes                                 +---------+---------------+---------+-----------+----------+--------------+ PFV      Full                                                        +---------+---------------+---------+-----------+----------+--------------+ POP      Full           Yes      Yes                                 +---------+---------------+---------+-----------+----------+--------------+ PTV  Full                                                         +---------+---------------+---------+-----------+----------+--------------+ PERO     Full                                                        +---------+---------------+---------+-----------+----------+--------------+   +---------+---------------+---------+-----------+----------+--------------+ LEFT     CompressibilityPhasicitySpontaneityPropertiesThrombus Aging +---------+---------------+---------+-----------+----------+--------------+ CFV      Full           Yes      Yes                                 +---------+---------------+---------+-----------+----------+--------------+ SFJ      Full                                                        +---------+---------------+---------+-----------+----------+--------------+ FV Prox  Full           Yes      Yes                                 +---------+---------------+---------+-----------+----------+--------------+ FV Mid   Full           Yes      Yes                                 +---------+---------------+---------+-----------+----------+--------------+ FV DistalFull           Yes      Yes                                 +---------+---------------+---------+-----------+----------+--------------+ PFV      Full                                                        +---------+---------------+---------+-----------+----------+--------------+ POP      Full           Yes      Yes                                 +---------+---------------+---------+-----------+----------+--------------+ PTV      Full                                                        +---------+---------------+---------+-----------+----------+--------------+ PERO  Full                                                        +---------+---------------+---------+-----------+----------+--------------+     Summary: BILATERAL: - No evidence of deep vein thrombosis seen in the lower extremities, bilaterally. -No evidence of  popliteal cyst, bilaterally.   *See table(s) above for measurements and observations.    Preliminary    DG Chest 2 View Result Date: 08/18/2023 CLINICAL DATA:  Shortness of breath.  Chest tightness EXAM: CHEST - 2 VIEW COMPARISON:  X-ray 08/09/2023 and older FINDINGS: Hyperinflation. Bilateral apical pleural thickening. No consolidation, pneumothorax or effusion. No edema. Normal cardiopericardial silhouette. Mild degenerative changes along the spine. IMPRESSION: No acute cardiopulmonary disease.  Hyperinflation. Electronically Signed   By: Ranell Bring M.D.   On: 08/18/2023 11:27     Procedures   Medications Ordered in the ED - No data to display  Clinical Course as of 08/18/23 1532  Wed Aug 18, 2023  1521 Handoff ART 63 yo f Here with exertional dib, leg swelling Workup so far is stable Pending CTA If CTA neg can go home w/ cardiology f/u [SG]    Clinical Course User Index [SG] Elnor Jayson LABOR, DO                                 Medical Decision Making 63 year old female with past medical history of hypertension presenting to the emergency department today with lower extremity swelling as well as shortness of breath and chest tightness.  The patient's workup from triage is largely unremarkable.  Given the patient's calf pain on the left with negative x-ray and cardiac workup this does increase the pretest probability of potential pulmonary embolism.  Will further evaluate with a CT angiogram to evaluate for this as well as ultrasounds of her bilateral lower extremities to evaluate for DVT.  The patient is not requiring any oxygen here.  If her workup is reassuring I think that she could potentially follow-up with cardiology as an outpatient.  The patient's cardiac workup is reassuring.  She is negative for DVT.  CT angiogram is pending at the time of signout.  Plan is for likely discharge if negative with cardiology follow-up.  Amount and/or Complexity of Data Reviewed Labs:  ordered. Radiology: ordered.        Final diagnoses:  Dyspnea on exertion  Localized swelling, mass, or lump of lower extremity, bilateral    ED Discharge Orders     None          Ula Prentice SAUNDERS, MD 08/18/23 [phone]

## 2023-08-18 NOTE — ED Notes (Signed)
 Patient transported to vascular.

## 2023-08-18 NOTE — ED Notes (Signed)
 2nd Trop due @1211 

## 2023-08-18 NOTE — ED Provider Triage Note (Signed)
 Emergency Medicine Provider Triage Evaluation Note  Susan Webb , a 63 y.o. female  was evaluated in triage.  Pt complains of chest pain, fatigue, intermittent shortness of breath that has been ongoing for the last 3 weeks.  Review of Systems  Positive: Denies any fever but is complaining of fatigue, intermittent shortness of breath, chest pain, Negative: Fever, vomiting  Physical Exam  BP (!) 160/94   Pulse 86   Temp 98.4 F (36.9 C)   Resp 16   Ht 5' 7 (1.702 m)   Wt 86.2 kg   LMP  (LMP Unknown)   SpO2 100%   BMI 29.76 kg/m  Gen:   Awake, no distress   Resp:  Normal effort  MSK:   Moves extremities without difficulty  Other:  No tachycardia on cardiac exam, no abdominal tenderness  Medical Decision Making  Medically screening exam initiated at 10:11 AM.  Appropriate orders placed.  IRAM ASTORINO was informed that the remainder of the evaluation will be completed by another provider, this initial triage assessment does not replace that evaluation, and the importance of remaining in the ED until their evaluation is complete.     Doretha Folks, MD 08/18/23 1013

## 2023-08-18 NOTE — ED Notes (Signed)
 Patient transported to CT

## 2023-08-18 NOTE — ED Triage Notes (Signed)
 BIB EMS from home with chest tightness and sob for 3 weeks. Started after having UTI. Has been evaluated for cp and sob. Given an inhaler which has been helping but today it did not. Sob with exertion.  BP 149/98, HR 85 RR 18 98%RA CBG 129.

## 2023-08-18 NOTE — ED Provider Notes (Signed)
  Provider Note MRN:  [id number]  Arrival date & time: 08/18/23    ED Course and Medical Decision Making  Assumed care from Dr Ula at shift change.  See note from prior team for complete details, in brief:   Clinical Course as of 08/18/23 2040  Wed Aug 18, 2023  1521 Handoff ART 63 yo f Here with exertional dib, leg swelling Workup so far is stable Pending CTA If CTA neg can go home w/ cardiology f/u [SG]  1852 CT PE stable [SG]    Clinical Course User Index [SG] Elnor Savant A, DO   Imaging stable, labs stable She is feeling better, no hypoxia with exertion. Symptoms have been ongoing for approximately 3 weeks or so. Will start patient on albuterol inhaler, also give her PPI as she is reporting some indigestion symptoms.  She has no abdominal pain. She is having some chest tightness but no discrete chest pain.  Likely atypical given negative troponin, EKG and x-ray. Encouraged patient follow-up with cardiology in the office. Follow-up PCP  Patient in no distress and overall condition is stable. Detailed discussions were had with the patient/guardian regarding current findings, and need for close f/u with PCP or on call doctor. The patient/guardian has been instructed to return immediately if the symptoms worsen in any way for re-evaluation. Patient/guardian verbalized understanding and is in agreement with current care plan. All questions answered prior to discharge.   Procedures  Final Clinical Impressions(s) / ED Diagnoses     ICD-10-CM   1. Dyspnea on exertion  R06.09 Ambulatory referral to Cardiology    2. Localized swelling, mass, or lump of lower extremity, bilateral  R22.43       ED Discharge Orders          Ordered    Ambulatory referral to Cardiology        08/18/23 2036    albuterol (VENTOLIN HFA) 108 (90 Base) MCG/ACT inhaler  Every 6 hours PRN        08/18/23 2037    pantoprazole (PROTONIX) 40 MG tablet  2 times daily        08/18/23 2037               Discharge Instructions      It was a pleasure caring for you today in the emergency department.  Please follow-up with primary care and with cardiologist.  Be sure to get plenty of rest over the next few days   Return to the Emergency Department if you have unusual chest pain, pressure, or discomfort, shortness of breath, nausea, vomiting, burping, heartburn, tingling upper body parts, sweating, cold, clammy skin, or racing heartbeat. Call 911 if you think you are having a heart attack. Take all cardiac medications as prescribed - notify your doctor if you have any side effects. Follow cardiac diet - avoid fatty & fried foods, don't eat too much red meat, eat lots of fruits & vegetables, and dairy products should be low fat. Please lose weight if you are overweight. Become more active with walking, gardening, or any other activity that gets you to moving.   Please return to the emergency department immediately for any new or concerning symptoms, or if you get worse.           Elnor Savant LABOR, DO 08/18/23 2040

## 2023-08-18 NOTE — ED Notes (Signed)
 BNP added on by Tiffany in lab

## 2023-08-18 NOTE — Progress Notes (Signed)
 BLE venous duplex has been completed.  Preliminary results given to Dr. Ula.   Results can be found under chart review under CV PROC. 08/18/2023 3:17 PM Malarie Tappen RVT, RDMS

## 2023-08-18 NOTE — Discharge Instructions (Addendum)
 It was a pleasure caring for you today in the emergency department.  Please follow-up with primary care and with cardiologist.  Be sure to get plenty of rest over the next few days   Return to the Emergency Department if you have unusual chest pain, pressure, or discomfort, shortness of breath, nausea, vomiting, burping, heartburn, tingling upper body parts, sweating, cold, clammy skin, or racing heartbeat. Call 911 if you think you are having a heart attack. Take all cardiac medications as prescribed - notify your doctor if you have any side effects. Follow cardiac diet - avoid fatty & fried foods, don't eat too much red meat, eat lots of fruits & vegetables, and dairy products should be low fat. Please lose weight if you are overweight. Become more active with walking, gardening, or any other activity that gets you to moving.   Please return to the emergency department immediately for any new or concerning symptoms, or if you get worse.

## 2023-09-03 ENCOUNTER — Ambulatory Visit: Admitting: Family Medicine

## 2023-09-08 ENCOUNTER — Ambulatory Visit: Admitting: Family Medicine

## 2023-09-15 DIAGNOSIS — Z0130 Encounter for examination of blood pressure without abnormal findings: Secondary | ICD-10-CM | POA: Diagnosis not present

## 2023-09-15 DIAGNOSIS — Z1389 Encounter for screening for other disorder: Secondary | ICD-10-CM | POA: Diagnosis not present

## 2023-09-15 DIAGNOSIS — Z1329 Encounter for screening for other suspected endocrine disorder: Secondary | ICD-10-CM | POA: Diagnosis not present

## 2023-09-15 DIAGNOSIS — Z0131 Encounter for examination of blood pressure with abnormal findings: Secondary | ICD-10-CM | POA: Diagnosis not present

## 2023-09-15 DIAGNOSIS — K5900 Constipation, unspecified: Secondary | ICD-10-CM | POA: Diagnosis not present

## 2023-09-15 DIAGNOSIS — Z862 Personal history of diseases of the blood and blood-forming organs and certain disorders involving the immune mechanism: Secondary | ICD-10-CM | POA: Diagnosis not present

## 2023-09-15 DIAGNOSIS — R002 Palpitations: Secondary | ICD-10-CM | POA: Diagnosis not present

## 2023-09-15 DIAGNOSIS — Z833 Family history of diabetes mellitus: Secondary | ICD-10-CM | POA: Diagnosis not present

## 2023-09-15 DIAGNOSIS — B370 Candidal stomatitis: Secondary | ICD-10-CM | POA: Diagnosis not present

## 2023-09-15 DIAGNOSIS — I10 Essential (primary) hypertension: Secondary | ICD-10-CM | POA: Diagnosis not present

## 2023-09-15 DIAGNOSIS — Z1159 Encounter for screening for other viral diseases: Secondary | ICD-10-CM | POA: Diagnosis not present

## 2023-09-20 DIAGNOSIS — I10 Essential (primary) hypertension: Secondary | ICD-10-CM | POA: Diagnosis not present

## 2023-09-20 DIAGNOSIS — Z1331 Encounter for screening for depression: Secondary | ICD-10-CM | POA: Diagnosis not present

## 2023-09-20 DIAGNOSIS — Z1389 Encounter for screening for other disorder: Secondary | ICD-10-CM | POA: Diagnosis not present

## 2023-09-20 DIAGNOSIS — R899 Unspecified abnormal finding in specimens from other organs, systems and tissues: Secondary | ICD-10-CM | POA: Diagnosis not present

## 2023-09-20 DIAGNOSIS — Z0131 Encounter for examination of blood pressure with abnormal findings: Secondary | ICD-10-CM | POA: Diagnosis not present

## 2023-10-04 ENCOUNTER — Telehealth: Payer: Self-pay | Admitting: *Deleted

## 2023-10-04 NOTE — Telephone Encounter (Signed)
 Unable to LVM to verify card hx due to no VM setup.

## 2023-10-05 ENCOUNTER — Encounter: Payer: Self-pay | Admitting: Obstetrics and Gynecology

## 2023-10-05 ENCOUNTER — Ambulatory Visit: Payer: Self-pay | Admitting: Obstetrics and Gynecology

## 2023-10-05 VITALS — BP 157/86 | HR 98 | Wt 174.0 lb

## 2023-10-05 DIAGNOSIS — N951 Menopausal and female climacteric states: Secondary | ICD-10-CM

## 2023-10-05 DIAGNOSIS — Z7189 Other specified counseling: Secondary | ICD-10-CM

## 2023-10-05 DIAGNOSIS — Z5320 Procedure and treatment not carried out because of patient's decision for unspecified reasons: Secondary | ICD-10-CM

## 2023-10-05 NOTE — Progress Notes (Signed)
 Obstetrics and Gynecology New Patient Evaluation  Appointment Date: 10/05/2023  OBGYN Clinic: [facility]   Primary Care Provider: [facility]  Chief Complaint: HRT discussion   History of Present Illness: Susan Webb is a 63 y.o.  [phone], seen for the above chief complaint.  Patient states that for the past six months she's noticed brain fog. She also states that sometime soon thereafter she was put on an abx for a UTI and this was strong and she believes caused her heart palpitation issues, for which she is seeing cardiology. She also notes lethargy, difficulty sleeping and lower abdominal discomfort which she believes are signs of a UTI. LMP in her early 2s and no bleeding or spotting since and no h/o HRT; she does states that she has used over the counter progesterone to try and help her s/s.   Review of Systems: Pertinent items are noted in HPI.   Past Medical History:  Past Medical History:  Diagnosis Date   Hypertension    Past Surgical History:  Past Surgical History:  Procedure Laterality Date   CESAREAN SECTION     CESAREAN SECTION     TUBAL LIGATION     Past Obstetrical History:  OB History  Gravida Para Term Preterm AB Living  4 4 4   6   SAB IAB Ectopic Multiple Live Births     2 6    # Outcome Date GA Lbr Len/2nd Weight Sex Type Anes PTL Lv  4 Term     F Vag-Spont   LIV  3 Term     M CS-Unspec  N LIV  2A Term     M CS-Unspec  N LIV  2B Term     F CS-Unspec   LIV  1A Term     F Vag-Spont  N LIV  1B Term     M Vag-Spont  N LIV   Past Gynecological History: As per HPI. History of Pap Smear(s): Yes.   Last pap unknown. She doesn't believe she's ever had abnormal paps  Social History:  Social History   Socioeconomic History   Marital status: Single    Spouse name: Not on file   Number of children: Not on file   Years of education: Not on file   Highest education level: Associate degree: academic  program  Occupational History   Not on file  Tobacco Use   Smoking status: Never   Smokeless tobacco: Not on file  Substance and Sexual Activity   Alcohol use: Never   Drug use: Never   Sexual activity: Not Currently  Other Topics Concern   Not on file  Social History Narrative   Not on file   Social Drivers of Health   Financial Resource Strain: Low Risk  (09/07/2023)   Overall Financial Resource Strain (CARDIA)    Difficulty of Paying Living Expenses: Not very hard  Food Insecurity: No Food Insecurity (09/07/2023)   Hunger Vital Sign    Worried About Running Out of Food in the Last Year: Never true    Ran Out of Food in the Last Year: Never true  Transportation Needs: Patient Declined (09/07/2023)   PRAPARE - Transportation    Lack of Transportation (Medical): Patient declined    Lack of Transportation (Non-Medical): Patient declined  Physical Activity: Insufficiently Active (09/07/2023)   Exercise Vital Sign    Days of Exercise per Week: 3 days    Minutes of Exercise per Session: 20 min  Stress: Stress Concern Present (09/07/2023)   [employer] of Occupational Health - Occupational Stress Questionnaire    Feeling of Stress: To some extent  Social Connections: Moderately Isolated (09/07/2023)   Social Connection and Isolation Panel    Frequency of Communication with Friends and Family: More than three times a week    Frequency of Social Gatherings with Friends and Family: Once a week    Attends Religious Services: 1 to 4 times per year    Active Member of [employer] or Organizations: No    Attends Engineer, structural: Not on file    Marital Status: Divorced  Catering manager Violence: Not on file   Family History:  Family History  Problem Relation Age of Onset   Diabetes type II Mother    Hypertension Mother     Medications Erminio DOROTHA Cedar had no medications administered during this visit. Current Outpatient Medications  Medication Sig Dispense Refill    albuterol  (VENTOLIN  HFA) 108 (90 Base) MCG/ACT inhaler Inhale 1-2 puffs into the lungs every 6 (six) hours as needed for wheezing or shortness of breath. 1 each 0   bisoprolol-hydrochlorothiazide (ZIAC) 5-6.25 MG tablet Take 1 tablet by mouth daily.     pantoprazole  (PROTONIX ) 40 MG tablet Take 1 tablet (40 mg total) by mouth 2 (two) times daily for 14 days. 28 tablet 0   No current facility-administered medications for this visit.   Allergies Prednisone  Physical Exam:  BP (!) 157/86   Pulse 98   Wt 174 lb (78.9 kg)   LMP  (LMP Unknown)   BMI 27.25 kg/m  Body mass index is 27.25 kg/m.  General appearance: Well nourished, well developed female in no acute distress.  Neuro/Psych:  Normal mood and affect.   Pelvic exam: exam declined by the patient.  Laboratory: none  Radiology: none  Assessment: patient stable  Plan: 1. Cervical cancer screening declined (Primary)  2. Counseling for hormone replacement therapy Long d/w her re: HRT. I told her I do not feel her s/s are related to anything GYN related and that HRT would be beneficial in her situation.  The main reason for this is the new onset of these s/s and that it has been nearly 20 years since she started menopause. She is wondering about OTC HRT. I told her I don't know of any with efficacy and that their side effects are unknown and may interact with medications she's on.   Given this, I told her that I recommend she follow up with her PCP re: these s/s.   Patient states she is getting set up by her PCP for mammogram and colon cancer screening.   RTC PRN; pt to make another appointment for pap smear.   Will CC her PCP   Bebe Izell Raddle MD Attending Center for [facility])

## 2023-10-05 NOTE — Progress Notes (Signed)
 CC: Wants to discuss Menopause and HRT  Recurrent UTI symptoms  Yeast in mouth-Nystatin   Wants to push off pap and annual, feeling overwhelmed

## 2023-10-07 ENCOUNTER — Inpatient Hospital Stay: Attending: Oncology | Admitting: Oncology

## 2023-10-07 ENCOUNTER — Inpatient Hospital Stay

## 2023-10-07 ENCOUNTER — Encounter: Payer: Self-pay | Admitting: Oncology

## 2023-10-07 VITALS — BP 135/78 | HR 95 | Temp 97.1°F | Resp 16 | Wt 168.0 lb

## 2023-10-07 DIAGNOSIS — R002 Palpitations: Secondary | ICD-10-CM | POA: Insufficient documentation

## 2023-10-07 DIAGNOSIS — D509 Iron deficiency anemia, unspecified: Secondary | ICD-10-CM | POA: Diagnosis not present

## 2023-10-07 DIAGNOSIS — I10 Essential (primary) hypertension: Secondary | ICD-10-CM | POA: Insufficient documentation

## 2023-10-07 DIAGNOSIS — Z79899 Other long term (current) drug therapy: Secondary | ICD-10-CM | POA: Insufficient documentation

## 2023-10-07 LAB — FERRITIN: Ferritin: 79 ng/mL (ref 11–307)

## 2023-10-07 LAB — TECHNOLOGIST SMEAR REVIEW: Plt Morphology: ADEQUATE

## 2023-10-07 LAB — CBC WITH DIFFERENTIAL/PLATELET
Abs Immature Granulocytes: 0.02 K/uL (ref 0.00–0.07)
Basophils Absolute: 0 K/uL (ref 0.0–0.1)
Basophils Relative: 0 %
Eosinophils Absolute: 0.1 K/uL (ref 0.0–0.5)
Eosinophils Relative: 1 %
HCT: 37.9 % (ref 36.0–46.0)
Hemoglobin: 11.8 g/dL — ABNORMAL LOW (ref 12.0–15.0)
Immature Granulocytes: 0 %
Lymphocytes Relative: 36 %
Lymphs Abs: 2.1 K/uL (ref 0.7–4.0)
MCH: 22.2 pg — ABNORMAL LOW (ref 26.0–34.0)
MCHC: 31.1 g/dL (ref 30.0–36.0)
MCV: 71.2 fL — ABNORMAL LOW (ref 80.0–100.0)
Monocytes Absolute: 0.5 K/uL (ref 0.1–1.0)
Monocytes Relative: 9 %
Neutro Abs: 3.1 K/uL (ref 1.7–7.7)
Neutrophils Relative %: 54 %
Platelets: 288 K/uL (ref 150–400)
RBC: 5.32 MIL/uL — ABNORMAL HIGH (ref 3.87–5.11)
RDW: 16.6 % — ABNORMAL HIGH (ref 11.5–15.5)
WBC: 5.8 K/uL (ref 4.0–10.5)
nRBC: 0 % (ref 0.0–0.2)

## 2023-10-07 LAB — IRON AND TIBC
Iron: 55 ug/dL (ref 28–170)
Saturation Ratios: 17 % (ref 10.4–31.8)
TIBC: 321 ug/dL (ref 250–450)
UIBC: 266 ug/dL

## 2023-10-07 LAB — RETIC PANEL
Immature Retic Fract: 12.5 % (ref 2.3–15.9)
RBC.: 5.35 MIL/uL — ABNORMAL HIGH (ref 3.87–5.11)
Retic Count, Absolute: 67.9 K/uL (ref 19.0–186.0)
Retic Ct Pct: 1.3 % (ref 0.4–3.1)
Reticulocyte Hemoglobin: 26.4 pg — ABNORMAL LOW (ref >27.9)

## 2023-10-07 NOTE — Assessment & Plan Note (Signed)
 Recommend patient to establish care with cardiology for evaluation.

## 2023-10-07 NOTE — Progress Notes (Signed)
 Hematology/Oncology Consult note [phone] [phone]        REFERRING PROVIDER: Autry Grayce LABOR, PA   CHIEF COMPLAINTS/REASON FOR VISIT:  Evaluation of abnormal CBC   ASSESSMENT & PLAN:   Microcytic anemia Chronic microcytic anemia Check cbc iron tibc ferritin, hemoglobinopathy evaluation, copper  level.   Palpitation Recommend patient to establish care with cardiology for evaluation.    Orders Placed This Encounter  Procedures   Iron and TIBC    Standing Status:   Future    Number of Occurrences:   1    Expected Date:   10/07/2023    Expiration Date:   10/06/2024   Ferritin    Standing Status:   Future    Number of Occurrences:   1    Expected Date:   10/07/2023    Expiration Date:   04/08/2024   CBC with Differential/Platelet    Standing Status:   Future    Number of Occurrences:   1    Expected Date:   10/07/2023    Expiration Date:   10/06/2024   Retic Panel    Standing Status:   Future    Number of Occurrences:   1    Expected Date:   10/07/2023    Expiration Date:   10/06/2024   Technologist smear review    Standing Status:   Future    Number of Occurrences:   1    Expiration Date:   10/06/2024    Clinical information::   microcytic anemia.   Copper , serum    Standing Status:   Future    Number of Occurrences:   1    Expected Date:   10/07/2023    Expiration Date:   10/06/2024   Hgb Fractionation Cascade    Standing Status:   Future    Number of Occurrences:   1    Expected Date:   10/07/2023    Expiration Date:   10/06/2024   Follow up in 3-4 weeks All questions were answered. The patient knows to call the clinic with any problems, questions or concerns.  Zelphia Cap, MD, PhD [facility] Hematology Oncology 10/07/2023   HISTORY OF PRESENTING ILLNESS:   Susan Webb is a  63 y.o.  female with PMH listed below was seen in consultation at the request of  Autry Grayce LABOR, [place]  for evaluation of abnormal CBC  She experiences heart  palpitations and a sensation of rapid heartbeats, describing these episodes as 'very rapid' and noting her occurrence since May. She is currently taking bisoprolol to manage these symptoms.  She has a long-standing history of anemia, recalling being unable to donate blood due to low hemoglobin levels despite a good diet. Recent blood work indicated microcytic red blood cells and a hemoglobin level of 11.   She has a history of asthma, which occurs seasonally and typically requires an inhaler once or twice a year. She only experiences symptoms at night and has not had any recent severe or frequent exacerbations.   She has intentionally lost weight, reducing from nearly 200 pounds to 159 pounds through diet and exercise, without the use of weight loss medications.  She works part-time in a cleaning role within the health field and has family members who also work in Teacher, music.  MEDICAL HISTORY:  Past Medical History:  Diagnosis Date   Asthma    Hypertension     SURGICAL HISTORY: Past Surgical History:  Procedure Laterality Date   CESAREAN SECTION  CESAREAN SECTION     TUBAL LIGATION      SOCIAL HISTORY: Social History   Socioeconomic History   Marital status: Single    Spouse name: Not on file   Number of children: Not on file   Years of education: Not on file   Highest education level: Associate degree: academic program  Occupational History   Not on file  Tobacco Use   Smoking status: Never   Smokeless tobacco: Never  Substance and Sexual Activity   Alcohol use: Never   Drug use: Never   Sexual activity: Not Currently  Other Topics Concern   Not on file  Social History Narrative   Not on file   Social Drivers of Health   Financial Resource Strain: Low Risk  (09/07/2023)   Overall Financial Resource Strain (CARDIA)    Difficulty of Paying Living Expenses: Not very hard  Food Insecurity: No Food Insecurity (10/07/2023)   Hunger Vital Sign    Worried About  Running Out of Food in the Last Year: Never true    Ran Out of Food in the Last Year: Never true  Transportation Needs: No Transportation Needs (10/07/2023)   PRAPARE - Administrator, Civil Service (Medical): No    Lack of Transportation (Non-Medical): No  Physical Activity: Insufficiently Active (09/07/2023)   Exercise Vital Sign    Days of Exercise per Week: 3 days    Minutes of Exercise per Session: 20 min  Stress: Stress Concern Present (09/07/2023)   [employer] of Occupational Health - Occupational Stress Questionnaire    Feeling of Stress: To some extent  Social Connections: Moderately Isolated (09/07/2023)   Social Connection and Isolation Panel    Frequency of Communication with Friends and Family: More than three times a week    Frequency of Social Gatherings with Friends and Family: Once a week    Attends Religious Services: 1 to 4 times per year    Active Member of [employer] or Organizations: No    Attends Engineer, structural: Not on file    Marital Status: Divorced  Intimate Partner Violence: Not At Risk (10/07/2023)   Humiliation, Afraid, Rape, and Kick questionnaire    Fear of Current or Ex-Partner: No    Emotionally Abused: No    Physically Abused: No    Sexually Abused: No    FAMILY HISTORY: Family History  Problem Relation Age of Onset   Diabetes type II Mother    Hypertension Mother     ALLERGIES:  is allergic to prednisone.  MEDICATIONS:  Current Outpatient Medications  Medication Sig Dispense Refill   albuterol  (VENTOLIN  HFA) 108 (90 Base) MCG/ACT inhaler Inhale 1-2 puffs into the lungs every 6 (six) hours as needed for wheezing or shortness of breath. 1 each 0   bisoprolol-hydrochlorothiazide (ZIAC) 5-6.25 MG tablet Take 1 tablet by mouth daily.     pantoprazole  (PROTONIX ) 40 MG tablet Take 1 tablet (40 mg total) by mouth 2 (two) times daily for 14 days. (Patient not taking: Reported on 10/07/2023) 28 tablet 0   No current  facility-administered medications for this visit.    Review of Systems  Constitutional:  Positive for fatigue. Negative for appetite change, chills and fever.  HENT:   Negative for hearing loss and voice change.   Eyes:  Negative for eye problems.  Respiratory:  Negative for chest tightness and cough.   Cardiovascular:  Negative for chest pain.       Palpitation  Gastrointestinal:  Negative for abdominal distention, abdominal pain and blood in stool.  Endocrine: Negative for hot flashes.  Genitourinary:  Negative for difficulty urinating and frequency.   Musculoskeletal:  Negative for arthralgias.  Skin:  Negative for itching and rash.  Neurological:  Negative for extremity weakness.  Hematological:  Negative for adenopathy.  Psychiatric/Behavioral:  Negative for confusion.    PHYSICAL EXAMINATION:  Vitals:   10/07/23 1102  BP: 135/78  Pulse: 95  Resp: 16  Temp: (!) 97.1 F (36.2 C)  SpO2: 100%   Filed Weights   10/07/23 1102  Weight: 168 lb (76.2 kg)    Physical Exam Constitutional:      General: She is not in acute distress. HENT:     Head: Normocephalic and atraumatic.  Eyes:     General: No scleral icterus. Cardiovascular:     Rate and Rhythm: Normal rate.  Pulmonary:     Effort: Pulmonary effort is normal. No respiratory distress.     Breath sounds: No wheezing.  Abdominal:     General: Bowel sounds are normal. There is no distension.     Palpations: Abdomen is soft.  Musculoskeletal:        General: No deformity. Normal range of motion.     Cervical back: Normal range of motion and neck supple.  Skin:    General: Skin is warm and dry.     Findings: No erythema or rash.  Neurological:     Mental Status: She is alert and oriented to person, place, and time. Mental status is at baseline.  Psychiatric:        Mood and Affect: Mood normal.     LABORATORY DATA:  I have reviewed the data as listed    Latest Ref Rng & Units 10/07/2023   11:52 AM  08/18/2023   10:11 AM 08/09/2023   11:23 AM  CBC  WBC 4.0 - 10.5 K/uL 5.8  4.4  4.6   Hemoglobin 12.0 - 15.0 g/dL 88.1  88.3  87.4   Hematocrit 36.0 - 46.0 % 37.9  37.3  41.0   Platelets 150 - 400 K/uL 288  288  299       Latest Ref Rng & Units 08/18/2023   10:11 AM 08/09/2023   11:23 AM 04/12/2022    9:37 PM  CMP  Glucose 70 - 99 mg/dL 893  897  891   BUN 8 - 23 mg/dL 9  9  9    Creatinine 0.44 - 1.00 mg/dL 9.34  9.33  9.33   Sodium 135 - 145 mmol/L 142  139  136   Potassium 3.5 - 5.1 mmol/L 3.4  3.9  3.1   Chloride 98 - 111 mmol/L 108  103  100   CO2 22 - 32 mmol/L 24  25  26    Calcium 8.9 - 10.3 mg/dL 9.4  9.7  9.4   Total Protein 6.5 - 8.1 g/dL 7.3     Total Bilirubin 0.0 - 1.2 mg/dL 0.6     Alkaline Phos 38 - 126 U/L 102     AST 15 - 41 U/L 23     ALT 0 - 44 U/L 25         RADIOGRAPHIC STUDIES: I have personally reviewed the radiological images as listed and agreed with the findings in the report. CT Angio Chest PE W and/or Wo Contrast Result Date: 08/18/2023 CLINICAL DATA:  Chest pain and shortness of breath EXAM: CT ANGIOGRAPHY CHEST WITH CONTRAST TECHNIQUE: Multidetector CT imaging  of the chest was performed using the standard protocol during bolus administration of intravenous contrast. Multiplanar CT image reconstructions and MIPs were obtained to evaluate the vascular anatomy. RADIATION DOSE REDUCTION: This exam was performed according to the departmental dose-optimization program which includes automated exposure control, adjustment of the mA and/or kV according to patient size and/or use of iterative reconstruction technique. CONTRAST:  67mL OMNIPAQUE  IOHEXOL  350 MG/ML SOLN COMPARISON:  08/18/2023 radiographs FINDINGS: Cardiovascular: No filling defect is identified in the pulmonary arterial tree to suggest pulmonary embolus. Mediastinum/Nodes: Small type 1 hiatal hernia. Lungs/Pleura: Mild biapical pleuroparenchymal scarring. Mild scarring or subsegmental atelectasis in the  posterior basal segment left lower lobe. Upper Abdomen: Unremarkable Musculoskeletal: Mild thoracic spondylosis. Review of the MIP images confirms the above findings. IMPRESSION: 1. No filling defect is identified in the pulmonary arterial tree to suggest pulmonary embolus. 2. Small type 1 hiatal hernia. 3. Mild thoracic spondylosis. Electronically Signed   By: Ryan Salvage M.D.   On: 08/18/2023 18:47   CT Head Wo Contrast Result Date: 08/18/2023 CLINICAL DATA:  Headache, increasing frequency or severity EXAM: CT HEAD WITHOUT CONTRAST TECHNIQUE: Contiguous axial images were obtained from the base of the skull through the vertex without intravenous contrast. RADIATION DOSE REDUCTION: This exam was performed according to the departmental dose-optimization program which includes automated exposure control, adjustment of the mA and/or kV according to patient size and/or use of iterative reconstruction technique. COMPARISON:  CT head 03/23/2008 FINDINGS: Brain: No evidence of large-territorial acute infarction. No parenchymal hemorrhage. No mass lesion. No extra-axial collection. No mass effect or midline shift. No hydrocephalus. Basilar cisterns are patent. Vascular: No hyperdense vessel. Skull: No acute fracture or focal lesion. Sinuses/Orbits: Paranasal sinuses and mastoid air cells are clear. The orbits are unremarkable. Other: None. IMPRESSION: No acute intracranial abnormality. Electronically Signed   By: Morgane  Naveau M.D.   On: 08/18/2023 18:44   VAS US  LOWER EXTREMITY VENOUS (DVT) (ONLY [facility] & [facility]) Result Date: 08/18/2023  Lower Venous DVT Study Patient Name:  TEEGHAN HAMMER  Date of Exam:   08/18/2023 Medical Rec #: [id number]        Accession #:    [id number] Date of Birth: June 21, 1960         Patient Gender: F Patient Age:   5 years Exam Location:  [facility] Procedure:      VAS US  LOWER EXTREMITY VENOUS (DVT) Referring Phys: PRENTICE TEE  --------------------------------------------------------------------------------  Indications: Edema, and SOB.  Comparison Study: No previous exams Performing Technologist: Jody Hill RVT, RDMS  Examination Guidelines: A complete evaluation includes B-mode imaging, spectral Doppler, color Doppler, and power Doppler as needed of all accessible portions of each vessel. Bilateral testing is considered an integral part of a complete examination. Limited examinations for reoccurring indications may be performed as noted. The reflux portion of the exam is performed with the patient in reverse Trendelenburg.  +---------+---------------+---------+-----------+----------+--------------+ RIGHT    CompressibilityPhasicitySpontaneityPropertiesThrombus Aging +---------+---------------+---------+-----------+----------+--------------+ CFV      Full           Yes      Yes                                 +---------+---------------+---------+-----------+----------+--------------+ SFJ      Full                                                        +---------+---------------+---------+-----------+----------+--------------+  FV Prox  Full           Yes      Yes                                 +---------+---------------+---------+-----------+----------+--------------+ FV Mid   Full           Yes      Yes                                 +---------+---------------+---------+-----------+----------+--------------+ FV DistalFull           Yes      Yes                                 +---------+---------------+---------+-----------+----------+--------------+ PFV      Full                                                        +---------+---------------+---------+-----------+----------+--------------+ POP      Full           Yes      Yes                                 +---------+---------------+---------+-----------+----------+--------------+ PTV      Full                                                         +---------+---------------+---------+-----------+----------+--------------+ PERO     Full                                                        +---------+---------------+---------+-----------+----------+--------------+   +---------+---------------+---------+-----------+----------+--------------+ LEFT     CompressibilityPhasicitySpontaneityPropertiesThrombus Aging +---------+---------------+---------+-----------+----------+--------------+ CFV      Full           Yes      Yes                                 +---------+---------------+---------+-----------+----------+--------------+ SFJ      Full                                                        +---------+---------------+---------+-----------+----------+--------------+ FV Prox  Full           Yes      Yes                                 +---------+---------------+---------+-----------+----------+--------------+ FV Mid   Full  Yes      Yes                                 +---------+---------------+---------+-----------+----------+--------------+ FV DistalFull           Yes      Yes                                 +---------+---------------+---------+-----------+----------+--------------+ PFV      Full                                                        +---------+---------------+---------+-----------+----------+--------------+ POP      Full           Yes      Yes                                 +---------+---------------+---------+-----------+----------+--------------+ PTV      Full                                                        +---------+---------------+---------+-----------+----------+--------------+ PERO     Full                                                        +---------+---------------+---------+-----------+----------+--------------+     Summary: BILATERAL: - No evidence of deep vein thrombosis seen in the lower extremities,  bilaterally. -No evidence of popliteal cyst, bilaterally.   *See table(s) above for measurements and observations. Electronically signed by Gaile New MD on 08/18/2023 at 5:00:52 PM.    Final    DG Chest 2 View Result Date: 08/18/2023 CLINICAL DATA:  Shortness of breath.  Chest tightness EXAM: CHEST - 2 VIEW COMPARISON:  X-ray 08/09/2023 and older FINDINGS: Hyperinflation. Bilateral apical pleural thickening. No consolidation, pneumothorax or effusion. No edema. Normal cardiopericardial silhouette. Mild degenerative changes along the spine. IMPRESSION: No acute cardiopulmonary disease.  Hyperinflation. Electronically Signed   By: Ranell Bring M.D.   On: 08/18/2023 11:27   DG Chest 2 View Result Date: 08/09/2023 CLINICAL DATA:  SOB EXAM: CHEST - 2 VIEW COMPARISON:  April 12, 2022 FINDINGS: Biapical pleural thickening. Flattening of both diaphragms, which can be seen in emphysema. No focal airspace consolidation, pleural effusion, or pneumothorax. No cardiomegaly. Tortuous aorta with aortic atherosclerosis. No acute fracture or destructive lesions. Multilevel thoracic osteophytosis. IMPRESSION: No acute cardiopulmonary abnormality. Electronically Signed   By: Rogelia Myers M.D.   On: 08/09/2023 13:11

## 2023-10-07 NOTE — Assessment & Plan Note (Addendum)
 Chronic microcytic anemia Check cbc iron tibc ferritin, hemoglobinopathy evaluation, copper  level.

## 2023-10-09 LAB — COPPER, SERUM: Copper: 131 ug/dL (ref 80–158)

## 2023-10-11 ENCOUNTER — Encounter: Payer: Self-pay | Admitting: Cardiovascular Disease

## 2023-10-11 ENCOUNTER — Ambulatory Visit: Attending: Cardiovascular Disease | Admitting: Cardiovascular Disease

## 2023-10-11 ENCOUNTER — Telehealth: Payer: Self-pay | Admitting: *Deleted

## 2023-10-11 VITALS — BP 128/78 | HR 100 | Ht 67.0 in | Wt 168.2 lb

## 2023-10-11 DIAGNOSIS — R002 Palpitations: Secondary | ICD-10-CM | POA: Insufficient documentation

## 2023-10-11 DIAGNOSIS — R0602 Shortness of breath: Secondary | ICD-10-CM | POA: Diagnosis not present

## 2023-10-11 DIAGNOSIS — Z79899 Other long term (current) drug therapy: Secondary | ICD-10-CM | POA: Diagnosis not present

## 2023-10-11 NOTE — Progress Notes (Signed)
 Cardiology Office Note  Date:  10/11/2023   ID:  Susan, Webb October 26, 1960, MRN [id number]  PCP:  [facility]   Chief Complaint  Patient presents with   New Patient (Initial Visit)    Ref by Caron Salt, DO for fatigue & LE edema. Patient c/o racing irregular heart beats, chest tightness, shortness of breath with little to no activity and very fatigue.     HPI:  Susan Webb a 63 y.o. female with past medical history of: Hypertension Asthma Who presents by referral from Dr. Caron Salt for consultation of her fatigue, lower extremity edema  Presenting to the emergency room August 09, 2023 with fatigue since treated for UTI Some shortness of breath on exertion ER workup unrevealing Was started on amlodipine  for blood pressure, referred to cardiology  ER August 18, 2023 shortness of breath, Chest pressure, leg swelling CT chest with no PE  Side effects on amlodipine , leg swelling Changed to bisoprolol hydrochlorothiazide 5-6.25  Normal very active before 2 months ago Now can't walk around track, breathless  Using albuterol  more than normal for SOB Not much wheezing, does not feel that his asthma Works at [employer], work is stressful  EKG personally reviewed by myself on todays visit EKG Interpretation Date/Time:  Monday October 11 2023 14:36:47 EDT Ventricular Rate:  100 PR Interval:  176 QRS Duration:  94 QT Interval:  362 QTC Calculation: 466 R Axis:   -17  Text Interpretation: Normal sinus rhythm Normal ECG When compared with ECG of 18-Aug-2023 10:02, Questionable change in QRS axis Confirmed by Perla Lye [phone]) on 10/11/2023 2:51:07 PM    PMH:   has a past medical history of Asthma and Hypertension.  PSH:    Past Surgical History:  Procedure Laterality Date   CESAREAN SECTION     CESAREAN SECTION     TUBAL LIGATION      Current Outpatient Medications  Medication Sig Dispense Refill   albuterol  (VENTOLIN  HFA) 108 (90  Base) MCG/ACT inhaler Inhale 1-2 puffs into the lungs every 6 (six) hours as needed for wheezing or shortness of breath. 1 each 0   b complex vitamins capsule Take 1 capsule by mouth daily.     bisoprolol-hydrochlorothiazide (ZIAC) 5-6.25 MG tablet Take 1 tablet by mouth daily.     Cholecalciferol (VITAMIN D3) 125 MCG (5000 UT) TBDP Take 5,000 Syringes by mouth daily.     MAGNESIUM CITRATE PO Take by mouth daily.     Multiple Vitamins-Minerals (MULTIVITAMIN WITH MINERALS) tablet Take 1 tablet by mouth daily.     pantoprazole  (PROTONIX ) 40 MG tablet Take 1 tablet (40 mg total) by mouth 2 (two) times daily for 14 days. 28 tablet 0   No current facility-administered medications for this visit.     Allergies:   Amlodipine  and Prednisone   Social History:  The patient  reports that she has never smoked. She has never used smokeless tobacco. She reports that she does not drink alcohol and does not use drugs.   Family History:   family history includes Diabetes type II in her mother; Heart attack in her father; Hypertension in her mother.    Review of Systems: Review of Systems  Constitutional: Negative.   HENT: Negative.    Respiratory:  Positive for shortness of breath.   Cardiovascular: Negative.   Gastrointestinal: Negative.   Musculoskeletal: Negative.   Neurological: Negative.   Psychiatric/Behavioral: Negative.    All other systems reviewed and are negative.  PHYSICAL EXAM: VS:  BP 128/78 (BP Location: Right Arm, Patient Position: Sitting, Cuff Size: Normal)   Pulse 100   Ht 5' 7 (1.702 m)   Wt 168 lb 4 oz (76.3 kg)   LMP  (LMP Unknown)   SpO2 98%   BMI 26.35 kg/m  , BMI Body mass index is 26.35 kg/m. GEN: Well nourished, well developed, in no acute distress HEENT: normal Neck: no JVD, carotid bruits, or masses Cardiac: RRR; no murmurs, rubs, or gallops,no edema  Respiratory:  clear to auscultation bilaterally, normal work of breathing GI: soft, nontender,  nondistended, + BS MS: no deformity or atrophy Skin: warm and dry, no rash Neuro:  Strength and sensation are intact Psych: euthymic mood, full affect   Recent Labs: 08/18/2023: ALT 25; B Natriuretic Peptide 9.2; BUN 9; Creatinine, Ser 0.65; Potassium 3.4; Sodium 142; TSH 1.169 10/07/2023: Hemoglobin 11.8; Platelets 288    Lipid Panel No results found for: CHOL, HDL, LDLCALC, TRIG    Wt Readings from Last 3 Encounters:  10/11/23 168 lb 4 oz (76.3 kg)  10/07/23 168 lb (76.2 kg)  10/05/23 174 lb (78.9 kg)     ASSESSMENT AND PLAN:  Problem List Items Addressed This Visit     Palpitation - Primary (Chronic)   Relevant Orders   EKG 12-Lead (Completed)   Other Visit Diagnoses       Medication management         Shortness of breath       Relevant Orders   ECHOCARDIOGRAM COMPLETE      Shortness of breath Etiology unclear, 2 recent visits to the emergency room unrevealing CT scan images pulled up and reviewed, no significant coronary calcification or aortic atherosclerosis - She does report prior history of asthma and has been using her inhaler more but does not feel symptoms are consistent with asthma exacerbation - Grossly appears euvolemic on clinical exam, EKG normal Blood pressure relatively well-controlled- -recommended echocardiogram to rule out structural heart disease, estimate right heart pressures -No strong indication for stress testing given no aortic atherosclerosis or coronary calcification  Palpitations Sinus tachycardia on EKG, likely anxious Recommend she continue her bisoprolol Reassurance provided  Essential hypertension Blood pressure is well controlled on today's visit. No changes made to the medications. Recommend she stay on bisoprolol HCT Intolerance of amlodipine , developed leg swelling  Leg swelling Reports over 1 year of chronic lower extremity swelling, does not appear to be pitting edema, has soft tissue swelling likely unrelated  to any cardiac issue -Compression hose recommended  Fatigue Etiology unclear, CBC stable Possibly secondary to anxiety/depression, finds it difficulty working at [employer]  [name] was seen in consultation for Dr. Caron Salt and be referred back to his office for ongoing care of the issues detailed above  Signed, Velinda Lunger, M.D., Ph.D. [facility] [place], [place] [phone]

## 2023-10-11 NOTE — Telephone Encounter (Signed)
 Patient would like to know about the ones that are abnormal is there anything that she can do to make it changes so that the levels go into normal.

## 2023-10-11 NOTE — Patient Instructions (Addendum)
 Medication Instructions:   No changes  OK to hold the aspirin  If you need a refill on your cardiac medications before your next appointment, please call your pharmacy.   Lab work: No new labs needed  Testing/Procedures:  Your physician has requested that you have an echocardiogram. Echocardiography is a painless test that uses sound waves to create images of your heart. It provides your doctor with information about the size and shape of your heart and how well your heart's chambers and valves are working.   You may receive an ultrasound enhancing agent through an IV if needed to better visualize your heart during the echo. This procedure takes approximately one hour.  There are no restrictions for this procedure.  This will take place at [address] [facility] [address], [place] [address]  Please note: We ask at that you not bring children with you during ultrasound (echo/ vascular) testing. Due to room size and safety concerns, children are not allowed in the ultrasound rooms during exams. Our front office staff cannot provide observation of children in our lobby area while testing is being conducted. An adult accompanying a patient to their appointment will only be allowed in the ultrasound room at the discretion of the ultrasound technician under special circumstances. We apologize for any inconvenience.   Follow-Up: At [facility], you and your health needs are our priority.  As part of our continuing mission to provide you with exceptional heart care, we have created designated Provider Care Teams.  These Care Teams include your primary Cardiologist (physician) and Advanced Practice Providers (APPs -  Physician Assistants and Nurse Practitioners) who all work together to provide you with the care you need, when you need it.  You will need a follow up appointment as needed  Providers on your designated Care Team:   Lonni Meager, NP Bernardino Bring, PA-C [place]  [place], [place]  COVID-19 Vaccine Information can be found at: [email or web address] For questions related to vaccine distribution or appointments, please email [email or web address] or call [phone].

## 2023-10-12 ENCOUNTER — Other Ambulatory Visit

## 2023-10-12 LAB — HGB FRACTIONATION CASCADE: Hgb A2: 1.2 % — ABNORMAL LOW (ref 1.8–3.2)

## 2023-10-12 LAB — HGB FRACTIONATION BY HPLC
Hgb A: 97.6 % (ref 96.4–98.8)
Hgb C: 0 %
Hgb E: 0 %
Hgb F: 0 % (ref 0.0–2.0)
Hgb S: 0 %
Hgb Variant: 1.2 % — ABNORMAL HIGH

## 2023-10-12 NOTE — Telephone Encounter (Signed)
 Dr. Babara, please see pt's message, did you want her to come in and do Alpha thalassemia lab test?

## 2023-10-20 ENCOUNTER — Ambulatory Visit: Admitting: Family Medicine

## 2023-10-21 ENCOUNTER — Ambulatory Visit: Admitting: Obstetrics & Gynecology

## 2023-11-09 ENCOUNTER — Inpatient Hospital Stay: Admitting: Oncology

## 2023-11-16 ENCOUNTER — Inpatient Hospital Stay: Attending: Oncology | Admitting: Oncology

## 2023-11-16 ENCOUNTER — Encounter: Payer: Self-pay | Admitting: Oncology

## 2023-11-16 ENCOUNTER — Inpatient Hospital Stay

## 2023-11-16 VITALS — BP 133/80 | HR 88 | Temp 97.6°F | Resp 18 | Wt 162.1 lb

## 2023-11-16 DIAGNOSIS — D509 Iron deficiency anemia, unspecified: Secondary | ICD-10-CM

## 2023-11-16 DIAGNOSIS — I10 Essential (primary) hypertension: Secondary | ICD-10-CM | POA: Diagnosis not present

## 2023-11-16 DIAGNOSIS — Z79899 Other long term (current) drug therapy: Secondary | ICD-10-CM | POA: Insufficient documentation

## 2023-11-16 DIAGNOSIS — J45909 Unspecified asthma, uncomplicated: Secondary | ICD-10-CM | POA: Insufficient documentation

## 2023-11-16 NOTE — Assessment & Plan Note (Signed)
 Chronic microcytic anemia Patient has normal iron level.  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  This variation usually does not cause any anemia and is clinically silent. This does not explain her microcytic anemia. I recommend to check alpha thalassemia QT PCR for further evaluation.  She has a family history of alpha thalassemia.

## 2023-11-16 NOTE — Progress Notes (Signed)
 Hematology/Oncology Progress note [phone] [phone]           REFERRING PROVIDER: [facility]*   CHIEF COMPLAINTS/REASON FOR VISIT:  Macrocytic anemia   ASSESSMENT & PLAN:   Microcytic anemia Chronic microcytic anemia Patient has normal iron level.  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  This variation usually does not cause any anemia and is clinically silent. This does not explain her microcytic anemia. I recommend to check alpha thalassemia QT PCR for further evaluation.  She has a family history of alpha thalassemia.   Orders Placed This Encounter  Procedures   Alpha-Thalassemia Analysis    Standing Status:   Future    Number of Occurrences:   1    Expected Date:   11/16/2023    Expiration Date:   11/15/2024    Indication:   microcytic anemia   Follow up as needed All questions were answered. The patient knows to call the clinic with any problems, questions or concerns.  Zelphia Cap, MD, PhD [facility] Hematology Oncology 11/16/2023   HISTORY OF PRESENTING ILLNESS:   Susan Webb is a  63 y.o.  female with PMH listed below was seen in consultation at the request of  [facility]*  for evaluation of abnormal CBC  She experiences heart palpitations and a sensation of rapid heartbeats, describing these episodes as 'very rapid' and noting her occurrence since May. She is currently taking bisoprolol to manage these symptoms.  She has a long-standing history of anemia, recalling being unable to donate blood due to low hemoglobin levels despite a good diet. Recent blood work indicated microcytic red blood cells and a hemoglobin level of 11.   She has a history of asthma, which occurs seasonally and typically requires an inhaler once or twice a year. She only experiences symptoms at night and has not had any recent severe or frequent exacerbations.   She has intentionally lost weight,  reducing from nearly 200 pounds to 159 pounds through diet and exercise, without the use of weight loss medications.  She works part-time in a cleaning role within the health field and has family members who also work in Teacher, music.  INTERVAL HISTORY Susan Webb is a 63 y.o. female who has above history reviewed by me today presents for follow up visit for microcytic anemia.  Patient had blood work done and presented to discuss results.  She reports for thalassemia family history.  No other new complaints.  MEDICAL HISTORY:  Past Medical History:  Diagnosis Date   Asthma    Hypertension     SURGICAL HISTORY: Past Surgical History:  Procedure Laterality Date   CESAREAN SECTION     CESAREAN SECTION     TUBAL LIGATION      SOCIAL HISTORY: Social History   Socioeconomic History   Marital status: Single    Spouse name: Not on file   Number of children: Not on file   Years of education: Not on file   Highest education level: Associate degree: academic program  Occupational History   Not on file  Tobacco Use   Smoking status: Never   Smokeless tobacco: Never  Vaping Use   Vaping status: Never Used  Substance and Sexual Activity   Alcohol use: Never   Drug use: Never   Sexual activity: Not Currently  Other Topics Concern   Not on file  Social History Narrative   Not on file   Social Drivers  of Health   Financial Resource Strain: Low Risk  (09/07/2023)   Overall Financial Resource Strain (CARDIA)    Difficulty of Paying Living Expenses: Not very hard  Food Insecurity: No Food Insecurity (10/07/2023)   Hunger Vital Sign    Worried About Running Out of Food in the Last Year: Never true    Ran Out of Food in the Last Year: Never true  Transportation Needs: No Transportation Needs (10/07/2023)   PRAPARE - Administrator, Civil Service (Medical): No    Lack of Transportation (Non-Medical): No  Physical Activity: Insufficiently Active (09/07/2023)   Exercise  Vital Sign    Days of Exercise per Week: 3 days    Minutes of Exercise per Session: 20 min  Stress: Stress Concern Present (09/07/2023)   [employer] of Occupational Health - Occupational Stress Questionnaire    Feeling of Stress: To some extent  Social Connections: Moderately Isolated (09/07/2023)   Social Connection and Isolation Panel    Frequency of Communication with Friends and Family: More than three times a week    Frequency of Social Gatherings with Friends and Family: Once a week    Attends Religious Services: 1 to 4 times per year    Active Member of [employer] or Organizations: No    Attends Engineer, structural: Not on file    Marital Status: Divorced  Intimate Partner Violence: Not At Risk (10/07/2023)   Humiliation, Afraid, Rape, and Kick questionnaire    Fear of Current or Ex-Partner: No    Emotionally Abused: No    Physically Abused: No    Sexually Abused: No    FAMILY HISTORY: Family History  Problem Relation Age of Onset   Diabetes type II Mother    Hypertension Mother    Heart attack Father     ALLERGIES:  is allergic to amlodipine  and prednisone.  MEDICATIONS:  Current Outpatient Medications  Medication Sig Dispense Refill   albuterol  (VENTOLIN  HFA) 108 (90 Base) MCG/ACT inhaler Inhale 1-2 puffs into the lungs every 6 (six) hours as needed for wheezing or shortness of breath. 1 each 0   bisoprolol-hydrochlorothiazide (ZIAC) 5-6.25 MG tablet Take 1 tablet by mouth daily.     pantoprazole  (PROTONIX ) 40 MG tablet Take 1 tablet (40 mg total) by mouth 2 (two) times daily for 14 days. 28 tablet 0   No current facility-administered medications for this visit.    Review of Systems  Constitutional:  Positive for fatigue. Negative for appetite change, chills and fever.  HENT:   Negative for hearing loss and voice change.   Eyes:  Negative for eye problems.  Respiratory:  Negative for chest tightness and cough.   Cardiovascular:  Negative for chest  pain.       Palpitation  Gastrointestinal:  Negative for abdominal distention, abdominal pain and blood in stool.  Endocrine: Negative for hot flashes.  Genitourinary:  Negative for difficulty urinating and frequency.   Musculoskeletal:  Negative for arthralgias.  Skin:  Negative for itching and rash.  Neurological:  Negative for extremity weakness.  Hematological:  Negative for adenopathy.  Psychiatric/Behavioral:  Negative for confusion.    PHYSICAL EXAMINATION:  Vitals:   11/16/23 1045 11/16/23 1053  BP: (!) 143/78 133/80  Pulse: 88   Resp: 18   Temp: 97.6 F (36.4 C)   SpO2: 94%    Filed Weights   11/16/23 1045  Weight: 162 lb 1.6 oz (73.5 kg)    Physical Exam Constitutional:  General: She is not in acute distress. HENT:     Head: Normocephalic and atraumatic.  Eyes:     General: No scleral icterus. Cardiovascular:     Rate and Rhythm: Normal rate.  Pulmonary:     Effort: Pulmonary effort is normal. No respiratory distress.     Breath sounds: No wheezing.  Abdominal:     General: Bowel sounds are normal. There is no distension.     Palpations: Abdomen is soft.  Musculoskeletal:        General: No deformity. Normal range of motion.     Cervical back: Normal range of motion and neck supple.  Skin:    General: Skin is warm and dry.     Findings: No erythema or rash.  Neurological:     Mental Status: She is alert and oriented to person, place, and time. Mental status is at baseline.  Psychiatric:        Mood and Affect: Mood normal.     LABORATORY DATA:  I have reviewed the data as listed    Latest Ref Rng & Units 10/07/2023   11:52 AM 08/18/2023   10:11 AM 08/09/2023   11:23 AM  CBC  WBC 4.0 - 10.5 K/uL 5.8  4.4  4.6   Hemoglobin 12.0 - 15.0 g/dL 88.1  88.3  87.4   Hematocrit 36.0 - 46.0 % 37.9  37.3  41.0   Platelets 150 - 400 K/uL 288  288  299       Latest Ref Rng & Units 08/18/2023   10:11 AM 08/09/2023   11:23 AM 04/12/2022    9:37 PM  CMP   Glucose 70 - 99 mg/dL 893  897  891   BUN 8 - 23 mg/dL 9  9  9    Creatinine 0.44 - 1.00 mg/dL 9.34  9.33  9.33   Sodium 135 - 145 mmol/L 142  139  136   Potassium 3.5 - 5.1 mmol/L 3.4  3.9  3.1   Chloride 98 - 111 mmol/L 108  103  100   CO2 22 - 32 mmol/L 24  25  26    Calcium 8.9 - 10.3 mg/dL 9.4  9.7  9.4   Total Protein 6.5 - 8.1 g/dL 7.3     Total Bilirubin 0.0 - 1.2 mg/dL 0.6     Alkaline Phos 38 - 126 U/L 102     AST 15 - 41 U/L 23     ALT 0 - 44 U/L 25         RADIOGRAPHIC STUDIES: I have personally reviewed the radiological images as listed and agreed with the findings in the report. No results found.

## 2023-11-18 ENCOUNTER — Ambulatory Visit

## 2023-11-23 LAB — ALPHA-THALASSEMIA ANALYSIS: IMAGE: 0

## 2023-11-27 ENCOUNTER — Ambulatory Visit: Payer: Self-pay | Admitting: Oncology

## 2024-01-10 ENCOUNTER — Ambulatory Visit

## 2024-02-18 ENCOUNTER — Ambulatory Visit

## 2024-03-28 ENCOUNTER — Ambulatory Visit
